# Patient Record
Sex: Female | Born: 1965 | Marital: Single
Health system: Southern US, Community
[De-identification: ages and names within clinical notes are randomized; demographics above are authoritative.]

## PROBLEM LIST (undated history)

## (undated) DIAGNOSIS — F319 Bipolar disorder, unspecified: Secondary | ICD-10-CM

## (undated) DIAGNOSIS — E119 Type 2 diabetes mellitus without complications: Secondary | ICD-10-CM

## (undated) DIAGNOSIS — F1910 Other psychoactive substance abuse, uncomplicated: Secondary | ICD-10-CM

## (undated) DIAGNOSIS — B1920 Unspecified viral hepatitis C without hepatic coma: Secondary | ICD-10-CM

## (undated) DIAGNOSIS — G8929 Other chronic pain: Secondary | ICD-10-CM

## (undated) DIAGNOSIS — F209 Schizophrenia, unspecified: Secondary | ICD-10-CM

## (undated) DIAGNOSIS — I38 Endocarditis, valve unspecified: Secondary | ICD-10-CM

---

## 2016-10-12 ENCOUNTER — Ambulatory Visit (INDEPENDENT_AMBULATORY_CARE_PROVIDER_SITE_OTHER): Payer: Medicaid Other | Admitting: Orthopaedic Surgery

## 2016-10-12 ENCOUNTER — Encounter (INDEPENDENT_AMBULATORY_CARE_PROVIDER_SITE_OTHER): Payer: Self-pay

## 2016-10-26 ENCOUNTER — Inpatient Hospital Stay (HOSPITAL_COMMUNITY): Payer: Medicaid Other

## 2016-10-26 ENCOUNTER — Encounter (HOSPITAL_COMMUNITY): Payer: Self-pay | Admitting: Pulmonary Disease

## 2016-10-26 ENCOUNTER — Inpatient Hospital Stay (HOSPITAL_COMMUNITY)
Admission: EM | Admit: 2016-10-26 | Discharge: 2016-11-20 | DRG: 296 | Disposition: E | Payer: Medicaid Other | Attending: Pulmonary Disease | Admitting: Pulmonary Disease

## 2016-10-26 ENCOUNTER — Emergency Department (HOSPITAL_COMMUNITY): Payer: Medicaid Other

## 2016-10-26 DIAGNOSIS — G253 Myoclonus: Secondary | ICD-10-CM | POA: Diagnosis not present

## 2016-10-26 DIAGNOSIS — Q893 Situs inversus: Secondary | ICD-10-CM | POA: Diagnosis not present

## 2016-10-26 DIAGNOSIS — I959 Hypotension, unspecified: Secondary | ICD-10-CM | POA: Diagnosis present

## 2016-10-26 DIAGNOSIS — N179 Acute kidney failure, unspecified: Secondary | ICD-10-CM | POA: Diagnosis present

## 2016-10-26 DIAGNOSIS — J9601 Acute respiratory failure with hypoxia: Secondary | ICD-10-CM

## 2016-10-26 DIAGNOSIS — G931 Anoxic brain damage, not elsewhere classified: Secondary | ICD-10-CM | POA: Diagnosis present

## 2016-10-26 DIAGNOSIS — Z529 Donor of unspecified organ or tissue: Secondary | ICD-10-CM

## 2016-10-26 DIAGNOSIS — G936 Cerebral edema: Secondary | ICD-10-CM | POA: Diagnosis present

## 2016-10-26 DIAGNOSIS — E872 Acidosis: Secondary | ICD-10-CM | POA: Diagnosis present

## 2016-10-26 DIAGNOSIS — E876 Hypokalemia: Secondary | ICD-10-CM | POA: Diagnosis present

## 2016-10-26 DIAGNOSIS — H5704 Mydriasis: Secondary | ICD-10-CM

## 2016-10-26 DIAGNOSIS — D72829 Elevated white blood cell count, unspecified: Secondary | ICD-10-CM | POA: Diagnosis present

## 2016-10-26 DIAGNOSIS — Z515 Encounter for palliative care: Secondary | ICD-10-CM | POA: Diagnosis present

## 2016-10-26 DIAGNOSIS — G935 Compression of brain: Secondary | ICD-10-CM | POA: Diagnosis not present

## 2016-10-26 DIAGNOSIS — F209 Schizophrenia, unspecified: Secondary | ICD-10-CM | POA: Diagnosis present

## 2016-10-26 DIAGNOSIS — B1920 Unspecified viral hepatitis C without hepatic coma: Secondary | ICD-10-CM | POA: Diagnosis present

## 2016-10-26 DIAGNOSIS — F1110 Opioid abuse, uncomplicated: Secondary | ICD-10-CM | POA: Diagnosis present

## 2016-10-26 DIAGNOSIS — E119 Type 2 diabetes mellitus without complications: Secondary | ICD-10-CM | POA: Diagnosis present

## 2016-10-26 DIAGNOSIS — G8929 Other chronic pain: Secondary | ICD-10-CM | POA: Diagnosis present

## 2016-10-26 DIAGNOSIS — E46 Unspecified protein-calorie malnutrition: Secondary | ICD-10-CM | POA: Diagnosis present

## 2016-10-26 DIAGNOSIS — J9602 Acute respiratory failure with hypercapnia: Secondary | ICD-10-CM | POA: Diagnosis present

## 2016-10-26 DIAGNOSIS — J9690 Respiratory failure, unspecified, unspecified whether with hypoxia or hypercapnia: Secondary | ICD-10-CM

## 2016-10-26 DIAGNOSIS — I4901 Ventricular fibrillation: Secondary | ICD-10-CM | POA: Diagnosis present

## 2016-10-26 DIAGNOSIS — F1410 Cocaine abuse, uncomplicated: Secondary | ICD-10-CM | POA: Diagnosis present

## 2016-10-26 DIAGNOSIS — I469 Cardiac arrest, cause unspecified: Principal | ICD-10-CM

## 2016-10-26 DIAGNOSIS — G9382 Brain death: Secondary | ICD-10-CM | POA: Diagnosis not present

## 2016-10-26 DIAGNOSIS — Z6828 Body mass index (BMI) 28.0-28.9, adult: Secondary | ICD-10-CM

## 2016-10-26 DIAGNOSIS — Z66 Do not resuscitate: Secondary | ICD-10-CM | POA: Diagnosis present

## 2016-10-26 DIAGNOSIS — Z452 Encounter for adjustment and management of vascular access device: Secondary | ICD-10-CM

## 2016-10-26 DIAGNOSIS — J690 Pneumonitis due to inhalation of food and vomit: Secondary | ICD-10-CM

## 2016-10-26 DIAGNOSIS — F319 Bipolar disorder, unspecified: Secondary | ICD-10-CM | POA: Diagnosis present

## 2016-10-26 DIAGNOSIS — Z9289 Personal history of other medical treatment: Secondary | ICD-10-CM

## 2016-10-26 DIAGNOSIS — Z79899 Other long term (current) drug therapy: Secondary | ICD-10-CM

## 2016-10-26 HISTORY — DX: Other psychoactive substance abuse, uncomplicated: F19.10

## 2016-10-26 HISTORY — DX: Bipolar disorder, unspecified: F31.9

## 2016-10-26 HISTORY — DX: Unspecified viral hepatitis C without hepatic coma: B19.20

## 2016-10-26 HISTORY — DX: Other chronic pain: G89.29

## 2016-10-26 HISTORY — DX: Type 2 diabetes mellitus without complications: E11.9

## 2016-10-26 HISTORY — DX: Schizophrenia, unspecified: F20.9

## 2016-10-26 HISTORY — DX: Endocarditis, valve unspecified: I38

## 2016-10-26 LAB — RAPID URINE DRUG SCREEN, HOSP PERFORMED
Amphetamines: NOT DETECTED
BENZODIAZEPINES: NOT DETECTED
Barbiturates: NOT DETECTED
COCAINE: NOT DETECTED
Opiates: POSITIVE — AB
Tetrahydrocannabinol: NOT DETECTED

## 2016-10-26 LAB — I-STAT ARTERIAL BLOOD GAS, ED
ACID-BASE DEFICIT: 4 mmol/L — AB (ref 0.0–2.0)
Acid-base deficit: 14 mmol/L — ABNORMAL HIGH (ref 0.0–2.0)
Bicarbonate: 16.4 mmol/L — ABNORMAL LOW (ref 20.0–28.0)
Bicarbonate: 19.7 mmol/L — ABNORMAL LOW (ref 20.0–28.0)
O2 SAT: 99 %
O2 SAT: 100 %
PCO2 ART: 58.3 mmHg — AB (ref 32.0–48.0)
PH ART: 7.057 — AB (ref 7.350–7.450)
PH ART: 7.395 (ref 7.350–7.450)
PO2 ART: 224 mmHg — AB (ref 83.0–108.0)
Patient temperature: 98.6
TCO2: 18 mmol/L (ref 0–100)
TCO2: 21 mmol/L (ref 0–100)
pCO2 arterial: 32.2 mmHg (ref 32.0–48.0)
pO2, Arterial: 327 mmHg — ABNORMAL HIGH (ref 83.0–108.0)

## 2016-10-26 LAB — CBC WITH DIFFERENTIAL/PLATELET
Basophils Absolute: 0 10*3/uL (ref 0.0–0.1)
Basophils Relative: 0 %
EOS ABS: 0 10*3/uL (ref 0.0–0.7)
EOS PCT: 0 %
HCT: 36.5 % (ref 36.0–46.0)
Hemoglobin: 11.7 g/dL — ABNORMAL LOW (ref 12.0–15.0)
LYMPHS ABS: 8 10*3/uL — AB (ref 0.7–4.0)
Lymphocytes Relative: 33 %
MCH: 29 pg (ref 26.0–34.0)
MCHC: 32.1 g/dL (ref 30.0–36.0)
MCV: 90.6 fL (ref 78.0–100.0)
MONO ABS: 1 10*3/uL (ref 0.1–1.0)
Monocytes Relative: 4 %
Neutro Abs: 15.2 10*3/uL — ABNORMAL HIGH (ref 1.7–7.7)
Neutrophils Relative %: 63 %
PLATELETS: 237 10*3/uL (ref 150–400)
RBC: 4.03 MIL/uL (ref 3.87–5.11)
RDW: 13.3 % (ref 11.5–15.5)
WBC: 24.2 10*3/uL — AB (ref 4.0–10.5)

## 2016-10-26 LAB — URINALYSIS, ROUTINE W REFLEX MICROSCOPIC
BILIRUBIN URINE: NEGATIVE
GLUCOSE, UA: 250 mg/dL — AB
KETONES UR: NEGATIVE mg/dL
Leukocytes, UA: NEGATIVE
Nitrite: NEGATIVE
PH: 7 (ref 5.0–8.0)
SPECIFIC GRAVITY, URINE: 1.011 (ref 1.005–1.030)

## 2016-10-26 LAB — URINE MICROSCOPIC-ADD ON

## 2016-10-26 LAB — I-STAT TROPONIN, ED: TROPONIN I, POC: 0 ng/mL (ref 0.00–0.08)

## 2016-10-26 LAB — ACETAMINOPHEN LEVEL

## 2016-10-26 LAB — APTT: aPTT: 37 seconds — ABNORMAL HIGH (ref 24–36)

## 2016-10-26 LAB — ETHANOL

## 2016-10-26 LAB — SALICYLATE LEVEL

## 2016-10-26 LAB — I-STAT BETA HCG BLOOD, ED (MC, WL, AP ONLY)

## 2016-10-26 LAB — PROTIME-INR
INR: 1.28
Prothrombin Time: 16 seconds — ABNORMAL HIGH (ref 11.4–15.2)

## 2016-10-26 MED ORDER — EPINEPHRINE PF 1 MG/ML IJ SOLN
0.5000 ug/min | INTRAVENOUS | Status: DC
  Administered 2016-10-26: 10 ug/min via INTRAVENOUS
  Filled 2016-10-26: qty 4

## 2016-10-26 MED ORDER — ATROPINE SULFATE 1 MG/ML IJ SOLN
INTRAMUSCULAR | Status: AC | PRN
  Administered 2016-10-26: 1 mg via INTRAVENOUS

## 2016-10-26 MED ORDER — SODIUM CHLORIDE 0.9 % IV SOLN: 250.0000 mL | INTRAVENOUS | Status: DC | PRN

## 2016-10-26 MED ORDER — STERILE WATER FOR INJECTION IV SOLN
INTRAVENOUS | Status: DC
  Administered 2016-10-26: 22:00:00 via INTRAVENOUS
  Filled 2016-10-26 (×8): qty 850

## 2016-10-26 MED ORDER — SODIUM CHLORIDE 0.9 % IV SOLN
3.0000 g | Freq: Once | INTRAVENOUS | Status: AC
  Administered 2016-10-26: 3 g via INTRAVENOUS
  Filled 2016-10-26: qty 3

## 2016-10-26 MED ORDER — SODIUM BICARBONATE 8.4 % IV SOLN
100.0000 meq | Freq: Once | INTRAVENOUS | Status: AC
  Administered 2016-10-26: 100 meq via INTRAVENOUS

## 2016-10-26 MED ORDER — FENTANYL CITRATE (PF) 100 MCG/2ML IJ SOLN: 50.0000 ug | INTRAMUSCULAR | Status: DC | PRN

## 2016-10-26 MED ORDER — PROPOFOL 1000 MG/100ML IV EMUL: 5.0000 ug/kg/min | Freq: Once | INTRAVENOUS | Status: DC

## 2016-10-26 MED ORDER — SODIUM CHLORIDE 0.9 % IV SOLN
INTRAVENOUS | Status: DC
  Administered 2016-10-26 – 2016-10-29 (×5): via INTRAVENOUS

## 2016-10-26 MED ORDER — PROPOFOL 1000 MG/100ML IV EMUL
INTRAVENOUS | Status: AC
  Administered 2016-10-27: 35 ug/kg/min via INTRAVENOUS
  Filled 2016-10-26: qty 100

## 2016-10-26 MED ORDER — HEPARIN SODIUM (PORCINE) 5000 UNIT/ML IJ SOLN
5000.0000 [IU] | Freq: Three times a day (TID) | INTRAMUSCULAR | Status: DC
  Administered 2016-10-26 – 2016-10-29 (×8): 5000 [IU] via SUBCUTANEOUS
  Filled 2016-10-26 (×8): qty 1

## 2016-10-26 MED ORDER — FAMOTIDINE IN NACL 20-0.9 MG/50ML-% IV SOLN
20.0000 mg | Freq: Two times a day (BID) | INTRAVENOUS | Status: DC
  Administered 2016-10-26 – 2016-10-29 (×6): 20 mg via INTRAVENOUS
  Filled 2016-10-26 (×5): qty 50

## 2016-10-26 MED ORDER — PROPOFOL 1000 MG/100ML IV EMUL
5.0000 ug/kg/min | INTRAVENOUS | Status: DC
  Administered 2016-10-26: 10 ug/kg/min via INTRAVENOUS

## 2016-10-26 MED ORDER — PROPOFOL 1000 MG/100ML IV EMUL
0.0000 ug/kg/min | INTRAVENOUS | Status: DC
  Administered 2016-10-27: 35 ug/kg/min via INTRAVENOUS
  Filled 2016-10-26: qty 100

## 2016-10-26 MED ORDER — CEFTRIAXONE SODIUM 1 G IJ SOLR
1.0000 g | Freq: Once | INTRAMUSCULAR | Status: DC
  Administered 2016-10-26: 1 g via INTRAVENOUS
  Filled 2016-10-26: qty 10

## 2016-10-26 MED ORDER — AZITHROMYCIN 500 MG IV SOLR
500.0000 mg | Freq: Once | INTRAVENOUS | Status: DC
  Filled 2016-10-26: qty 500

## 2016-10-26 MED ORDER — INSULIN ASPART 100 UNIT/ML ~~LOC~~ SOLN
2.0000 [IU] | SUBCUTANEOUS | Status: DC
  Administered 2016-10-27 – 2016-10-29 (×8): 2 [IU] via SUBCUTANEOUS

## 2016-10-26 NOTE — ED Provider Notes (Signed)
[facility]-EMERGENCY DEPT Provider Note   CSN: [id number] Arrival date & time: 10/21/2016  1955  History   Chief Complaint No chief complaint on file.  HPI Melanie [name] is a 50 y.o. female.   Cardiac Arrest  Witnessed by:  Not witnessed Time since incident:  50 minutes Time before BLS initiated:  > 5 minutes Time before ALS initiated:  > 10 minutes Condition upon EMS arrival:  Unresponsive Pulse:  Absent Initial cardiac rhythm per EMS:  PEA Treatments prior to arrival:  ACLS protocol Airway:  Intubation prior to arrival Rhythm on admission to ED:  Normal sinus   Past Medical History:  Diagnosis Date  . Bipolar 1 disorder (HCC)   . Chronic pain   . DM (diabetes mellitus) (HCC)   . Hepatitis C   . Intravenous drug abuse   . Polysubstance abuse   . Schizophrenia [facility](HCC)     Patient Active Problem List   Diagnosis Date Noted  . Cardiac arrest (HCC) Oct 11, 2016   No past surgical history on file.  OB History    No data available     Home Medications    Prior to Admission medications   Not on File    Family History No family history on file.  Social History Social History  Substance Use Topics  . Smoking status: Not on file  . Smokeless tobacco: Not on file  . Alcohol use Not on file   Allergies   Patient has no allergy information on record.   Review of Systems Review of Systems  Unable to perform ROS: Intubated   Physical Exam Updated Vital Signs BP (!) 75/61   Pulse 64   Resp 20   SpO2 100%   Physical Exam  Constitutional:  Intubated female  HENT:  Head: Normocephalic and atraumatic.  Eyes:  Fixed pupils 8 mm bilaterally  Neck: Normal range of motion. Neck supple.  Cardiovascular: Normal rate and regular rhythm.   Sinus rhythm  Pulmonary/Chest:  Course breath sounds bilaterally  Abdominal: Soft. She exhibits no distension. There is no tenderness.  Musculoskeletal: She exhibits no edema.  Neurological:  Unresponsive  Skin: Skin is dry.  Capillary refill takes more than 3 seconds. There is pallor.  Psychiatric:  Unresponsive  Nursing note and vitals reviewed.  ED Treatments / Results  Labs (all labs ordered are listed, but only abnormal results are displayed) Labs Reviewed  I-STAT ARTERIAL BLOOD GAS, ED - Abnormal; Notable for the following:       Result Value   pH, Arterial 7.057 (*)    pCO2 arterial 58.3 (*)    pO2, Arterial 224.0 (*)    Bicarbonate 16.4 (*)    Acid-base deficit 14.0 (*)    All other components within normal limits  CBC WITH DIFFERENTIAL/PLATELET  APTT  PROTIME-INR  RAPID URINE DRUG SCREEN, HOSP PERFORMED  URINALYSIS, ROUTINE W REFLEX MICROSCOPIC (NOT AT [facility])  ACETAMINOPHEN LEVEL  ETHANOL  SALICYLATE LEVEL  I-STAT TROPOININ, ED  I-STAT BETA HCG BLOOD, ED ([facility], [facility], [facility] ONLY)   EKG  EKG Interpretation  Date/Time:  Monday October 26 2016 20:20:17 EST Ventricular Rate:  97 PR Interval:    QRS Duration: 74 QT Interval:  398 QTC Calculation: 506 R Axis:   176 Text Interpretation:  Sinus rhythm Ventricular premature complex Anterolateral infarct, age indeterminate Baseline wander in lead(s) V3 No significant change since last tracing Confirmed by YAO  MD, DAVID ([id number]) on 11/05/2016 8:35:30 PM      Radiology Dg Chest Portable  1 View  Result Date: 10/24/2016 CLINICAL DATA:  50 y/o  F; status post CPR and intubation. EXAM: PORTABLE CHEST 1 VIEW COMPARISON:  None. FINDINGS: Situs inversus and dextro cardia. Transcutaneous pacing pads are on left hemi thorax. Enteric tube coilsin the right upper quadrant stomach with tip below the field of view in the abdomen. Endotracheal tube tip is at the carina on the first chest radiograph and on the third has been retracted 2.5 cm from the carina. Ill-defined left cardiophrenic angle opacity. No acute osseous abnormality is evident. IMPRESSION: Situs inversus and dextrocardia. Endotracheal tube tip is at the carina on the first chest radiograph and on the  third has been retracted 2.5 cm from the carina. Ill-defined left basilar opacity. Electronically Signed   By: Mitzi HansenLance  Furusawa-Stratton M.D.   On: 11/05/2016 21:00    Procedures Procedures (including critical care time)  Medications Ordered in ED Medications  EPINEPHrine (ADRENALIN) 4 mg in dextrose 5 % 250 mL (0.016 mg/mL) infusion (0 mcg/min Intravenous Stopped 11/08/2016 2128)  sodium bicarbonate 150 mEq in sterile water 1,000 mL infusion (not administered)  cefTRIAXone (ROCEPHIN) 1 g in dextrose 5 % 50 mL IVPB (not administered)  azithromycin (ZITHROMAX) 500 mg in dextrose 5 % 250 mL IVPB (not administered)  propofol (DIPRIVAN) 1000 MG/100ML infusion (29 mcg/kg/min  72.6 kg Intravenous Rate/Dose Change 11/16/2016 2141)  atropine injection (1 mg Intravenous Given 10/25/2016 1958)  sodium bicarbonate injection 100 mEq (100 mEq Intravenous Given 11/17/2016 2112)    Initial Impression / Assessment and Plan / ED Course  I have reviewed the triage vital signs and the nursing notes.  Pertinent labs & imaging results that were available during my care of the patient were reviewed by me and considered in my medical decision making (see chart for details).  Clinical Course    Melanie [name] is a 50 year old female with reported chronic abuse history who presents to emergency Department today status post cardiac arrest. Per EMS patient had been found by her significant other reportedly 30 minutes after he spoke to her, pulseless and apneic. EMS arrived and patient was in PEA. Patient received 5 rounds of epinephrine, CPR and regained pulses. She was intubated by EMS. En route patient lost pulses twice, once with ventricular fibrillation and was subsequently shocked with ROSC and the second time PEA which was regained after another milligram of epi. Patient was placed on epi drip.  Upon arrival patient has a right IO in place. Left 18-gauge EJ was placed. Patient became bradycardic and was given push dose epi  with subsequent improvement hemodynamics and pulse.  Fluids were started an epinephrine drip was continued. Chest x-ray confirmed ET tube placement. OG was placed.  Patient has fixed dilated pupils. PH is 7.056 more than likely secondary to lactic acidosis. Concern for significant neurological deficit and declined given her prolonged downtime.  Propofol gtt started for sedation with new gag reflex.  Patient found to have opacities on x-ray, more than likely aspiration in nature. ABX given.   Patient admitted to ICU who requested bicarb bolus and infusion.   Final Clinical Impressions(s) / ED Diagnoses   Final diagnoses:  Cardiac arrest [facility](HCC)     Deirdre PeerJeremiah Jamya Starry, MD 10/27/16 0009    Charlynne Panderavid Hsienta Yao, MD 10/27/16 [phone]

## 2016-10-26 NOTE — H&P (Signed)
PULMONARY / CRITICAL CARE MEDICINE   Name: Melanie [name] MRN: [id number] DOB: 1966-07-16    ADMISSION DATE:  10/21/2016 CONSULTATION DATE:  11/6  REFERRING MD:  Dr. Silverio Lay EDP  CHIEF COMPLAINT:  Cardiac arrest  HISTORY OF PRESENT ILLNESS:  Patient is encephalopathic and/or intubated. Therefore history has [name] obtained from chart review. 50 year old female with known history of DM, bipolar, shizophrenia, and polysubstance abuse (heroin, cocaine) found down at home with an unwitnessed cardiac arrest. Last known well 30 minutes prior while speaking with boyfriend telephone. Upon EMS arrival she was found to be in asystole and ACLS protocol was initiated, and continued for 22 minutes. She briefly regained pulses and lost him again with ventricular fibrillation, which responded to epinephrine and defibrillation. She remained bradycardic and was paced in route to the emergency room. Upon arrival to the emergency department she was placed on ventilator and started on epinephrine infusion. Neurologic exam remained poor with pupils fixed and dilated, comatose, and no cough/gag. Pulmonary critical care asked to admit.  PAST MEDICAL HISTORY :  She  has a past medical history of Bipolar 1 disorder (HCC); Chronic pain; DM (diabetes mellitus) (HCC); Hepatitis C; Intravenous drug abuse; Polysubstance abuse; and Schizophrenia (HCC).  PAST SURGICAL HISTORY: She  has no past surgical history on file.  Allergies not on file  No current facility-administered medications on file prior to encounter.    No current outpatient prescriptions on file prior to encounter.    FAMILY HISTORY:  Her has no family status information on file.    SOCIAL HISTORY: She    REVIEW OF SYSTEMS:   Unable  SUBJECTIVE:    VITAL SIGNS: BP (!) 140/119   Pulse 95   Temp (!) 92.7 F (33.7 C) (Rectal)   Resp 15   Ht 5\' 2"  (1.575 m)   Wt 72.6 kg (160 lb)   SpO2 99%   BMI 29.26 kg/m   HEMODYNAMICS:    VENTILATOR  SETTINGS: Vent Mode: PRVC FiO2 (%):  [60 %] 60 % Set Rate:  [20 bmp] 20 bmp Vt Set:  [430 mL] 430 mL PEEP:  [5 cmH20] 5 cmH20 Plateau Pressure:  [23 cmH20] 23 cmH20  INTAKE / OUTPUT: No intake/output data recorded.  PHYSICAL EXAMINATION: General:  Thin malnourished female on ventilator Neuro:  Comatose, no cough, no gag, pupils fixed, dilated HEENT:  [place]/AT, no appreciable JVD Cardiovascular:  RRR, no MRG Lungs:  Coarse Abdomen:  Soft, non-distended Musculoskeletal:  No acute deformity Skin:  Grossly intact  LABS:  BMET No results for input(s): NA, K, CL, CO2, BUN, CREATININE, GLUCOSE in the last 168 hours.  Electrolytes No results for input(s): CALCIUM, MG, PHOS in the last 168 hours.  CBC  Recent Labs Lab  2006  WBC 24.2*  HGB 11.7*  HCT 36.5  PLT 237    Coag's  Recent Labs Lab 11/18/2016 2006  APTT 37*  INR 1.28    Sepsis Markers No results for input(s): LATICACIDVEN, PROCALCITON, O2SATVEN in the last 168 hours.  ABG  Recent Labs Lab 10/24/2016 2025  PHART 7.057*  PCO2ART 58.3*  PO2ART 224.0*    Liver Enzymes No results for input(s): AST, ALT, ALKPHOS, BILITOT, ALBUMIN in the last 168 hours.  Cardiac Enzymes No results for input(s): TROPONINI, PROBNP in the last 168 hours.  Glucose No results for input(s): GLUCAP in the last 168 hours.  Imaging Dg Chest Portable 1 View  Result Date: 11/02/2016 CLINICAL DATA:  50 y/o  F; status post CPR and  intubation. EXAM: PORTABLE CHEST 1 VIEW COMPARISON:  None. FINDINGS: Situs inversus and dextro cardia. Transcutaneous pacing pads are on left hemi thorax. Enteric tube coilsin the right upper quadrant stomach with tip below the field of view in the abdomen. Endotracheal tube tip is at the carina on the first chest radiograph and on the third has [name] retracted 2.5 cm from the carina. Ill-defined left cardiophrenic angle opacity. No acute osseous abnormality is evident. IMPRESSION: Situs inversus and  dextrocardia. Endotracheal tube tip is at the carina on the first chest radiograph and on the third has [name] retracted 2.5 cm from the carina. Ill-defined left basilar opacity. Electronically Signed   By: Mitzi HansenLance  Furusawa-Stratton M.D.   On: 2016/02/08 21:00     STUDIES:  CT head >>>  CULTURES: Blood 11/6 >  ANTIBIOTICS: unasyn 11/6  SIGNIFICANT EVENTS: 11/6 cardiac arrest  LINES/TUBES: ETT 11/6 >  DISCUSSION: 50 year old female with known psychiatric history and history of polysubstance abuse. Suffered an unwitnessed cardiac arrest with potentially 30 minutes of downtime prior to being found. Once found EMS was called and responded with at least 22 minutes of CPR. Initial rhythm asystole. Now his hemodynamic stable off pressors, but is on ventilator with very poor neurologic exam. Admit to ICU for supportive care and further diagnostic workup. Not a candidate for targeted temperature management and therapy.  ASSESSMENT / PLAN:  PULMONARY A: Inability to protect airway status post cardiac arrest  P:   Full vent support Follow ABG CXR in AM VAP bundle  CARDIOVASCULAR A:  Asystolic cardiac arrest 45mins, suspect in setting overdose. Situs inversus, dextrocardia  P:  Telemetry monitoring Not a candidate for targeted temperature management due to character, duration, and likely cause of cardiac arrest. Epinephrine infusion as needed to keep MAP greater than 65 mmHg Follow cardiac enzymes Echocardiogram  RENAL A:   Labs pending  P:   Follow renal function and urine output Correct electrolytes as indicated  GASTROINTESTINAL A:   No acute issues  P:   NPO Pepcid for SUP  HEMATOLOGIC A:   No acute issues P: Follow CBC Subcutaneous heparin SCDs  INFECTIOUS A:   Leukocytosis - possibly in setting of aspiration, although no clear evidence of this  P:   Follow cultures  Unasyn for possible aspiration pneumonia  ENDOCRINE A:   Diabetes mellitus  P:    CBG monitoring   sliding-scale insulin  NEUROLOGIC A:    Acute anoxic encephalopathy  P:   RASS goal: 0 to -1 Propofol to help with vent synchrony CT head pending EEG Avoid fevers  FAMILY  - Updates: Sister extensively updated in emergency room. Patient's daughter lives here in [place] and her mother lives in [place]. Sister will fill them both in and they will call with questions.  - Inter-disciplinary family meet or Palliative Care meeting due by:  11/12   Joneen RoachPaul Hoffman, AGACNP-BC [facility] Pager [phone] or [phone]  10/24/2016 10:20 PM    ATTENDING NOTE / ATTESTATION NOTE :   I have discussed the case with the resident/APP  Joneen RoachPaul Hoffman.   I agree with the resident/APP's  history, physical examination, assessment, and plans.    I have edited the above note and modified it according to our agreed history, physical examination, assessment and plan.   Briefly, pt admitted after being found unresponsive by her boyfriend at home.  She has chronic pain issues (on suboxone), recent diagnosis of hepatitis, on top of DM, bipolar, schizophrenia.  Boyfriend spoke  with her 30 minutes before she was found unresponsive. Total downtime roughly at least 40 minutes per EMS/ED report.  She was pulseless for the most part, had 1 episode of vfib for which she had defibrillation. She was started on epinephrine drip at the ER which was subsequently weaned off. She was started on propofol drip for myoclonic jerks and ventilator dyssynchrony.  Patient was seen, examined. Sedated. Breathing over the vent. Blood pressure 133/96, heart rate 80, respiratory rate 30, 100% O2 sats on 60% FiO2. Hypothermic. As mentioned, patient was sedated with propofol  when I saw her. Pupils were 4- 5 mm bilaterally non reactive, (-) visual threat, (-) corneals, (-) facial asymmetry, (-) gag, (-) grimace to deep sternal rub. Negative neck vein distention. Good breath sounds, equal  and bilateral. Good S1, S2. No murmurs heard. Diminished bowel sounds. No masses or tenderness. No edema. Cool extremities. Pulses were palpable. Rest of exam per Paul's exam.   Labs reviewed. 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 Possible left lower lobe infiltrate versus atelectasis. ABG 7.4/32/327 and a think that's on 100% FiO2. WBC 24. Blood alcohol was less than 5. Drug screen was positive for opiates. Rest of chemistries was pending by the time I saw her.   Assessment : 1. Acute hypoxemic hypercapnic respiratory failure secondary to unable to protect the airway. Rule out aspiration pneumonia. 2. S/P Prolonged cardiac arrest, 40 minutes downtime per EMS/ED records. Possible drug overdose by history.  3. 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  4. Likely with demand ischemia, lactic acidosis, AKI, metabolic acidosis 2/2 above >> labs still pending.   Plan : 1. We were able to speak with patient's daughter who lives in town. The patient's mother and son live in [place]. The mother has [name] updated. Patient's boyfriend has also [name] updated. We mentioned to the patient's daughter that the patient is critically ill and she has most likely  sustained significant neurologic injury secondary to her prolonged cardiac arrest. We mentioned to her that she is critically ill. They were obviously not ready to discuss CODE STATUS now. She is currently a full code. I mentioned the next 12-24 hours, we need to  reassess her neurologic status and reassess her CODE STATUS. 2. Continue ventilatory support. Not ready for weaning. Titrate FiO2 to keep O2 saturation more than or equal to 90%. 3. Follow-up on chemistries, lactic acid. 4. Continue Unasyn for aspiration pneumonia. Suggest to de-escalate once with cultures and pro calcitonin level. 5. Continue IV fluids. 6. Keep nothing by mouth for now. 7. In the morning, reassess if it can take her  off the propofol. 8. Patient will need a cranial CT scan. 9. We decided to hold off on therapeutic hypothermia because of prolonged downtime in poor prognosis. 10.  Cont other meds.  11. DVT prophylaxis.   I have spent 35  minutes of critical care time with this patient today.  Family :Family updated at length today.  We spoke with patient's daughter.   Pollie MeyerJ. Angelo A de Dios, MD 11/11/2016, 11:52 PM [facility] Pager [phone] After 3 pm or if no answer, call [phone]

## 2016-10-26 NOTE — ED Notes (Signed)
Bair hugger applied.

## 2016-10-26 NOTE — ED Notes (Signed)
Attempted report 

## 2016-10-26 NOTE — ED Notes (Signed)
Failed IV attempt x1. IV infiltrated

## 2016-10-26 NOTE — ED Notes (Signed)
Epi started a minute

## 2016-10-26 NOTE — Progress Notes (Signed)
Chaplain was notifiued of a post cpr. Pt came in not alert. Chaplain notified front dest to notify him when family came. No one show up.   10/31/2016 2100  Clinical Encounter Type  Visited With Patient  Visit Type Initial  Referral From Care management  Spiritual Encounters  Spiritual Needs Emotional  Stress Factors  Patient Stress Factors None identified

## 2016-10-26 NOTE — ED Notes (Signed)
Pt arrives to ED after a cardiac arrest, found down and was in asystole upon EMS arrival. Initial rhythm asystole. They performed approx 22 minutes CPR and got pulses back once, pt noted to be in sinus tach then bradyed down and arrested again. One round of epi put patient in bradycardia again, EMS then paced for about 6 minutes intermittently. ROSC at 1947. Pt received a total of 6 epis and 2MG  narcan. Pupils nonreactive, 5MM  Hx diabetes and substance abuse. No family at bedside at this time.

## 2016-10-26 NOTE — Code Documentation (Signed)
No purposeful movement at this time.

## 2016-10-27 ENCOUNTER — Encounter (HOSPITAL_COMMUNITY): Payer: Self-pay | Admitting: *Deleted

## 2016-10-27 ENCOUNTER — Inpatient Hospital Stay (HOSPITAL_COMMUNITY): Payer: Medicaid Other

## 2016-10-27 DIAGNOSIS — I10 Essential (primary) hypertension: Secondary | ICD-10-CM

## 2016-10-27 DIAGNOSIS — G931 Anoxic brain damage, not elsewhere classified: Secondary | ICD-10-CM

## 2016-10-27 DIAGNOSIS — J9601 Acute respiratory failure with hypoxia: Secondary | ICD-10-CM | POA: Insufficient documentation

## 2016-10-27 DIAGNOSIS — J690 Pneumonitis due to inhalation of food and vomit: Secondary | ICD-10-CM | POA: Insufficient documentation

## 2016-10-27 LAB — BLOOD CULTURE ID PANEL (REFLEXED)
ACINETOBACTER BAUMANNII: NOT DETECTED
CANDIDA PARAPSILOSIS: NOT DETECTED
Candida albicans: NOT DETECTED
Candida glabrata: NOT DETECTED
Candida krusei: NOT DETECTED
Candida tropicalis: NOT DETECTED
ENTEROCOCCUS SPECIES: NOT DETECTED
Enterobacter cloacae complex: NOT DETECTED
Enterobacteriaceae species: NOT DETECTED
Escherichia coli: NOT DETECTED
HAEMOPHILUS INFLUENZAE: NOT DETECTED
KLEBSIELLA OXYTOCA: NOT DETECTED
Klebsiella pneumoniae: NOT DETECTED
Listeria monocytogenes: NOT DETECTED
METHICILLIN RESISTANCE: DETECTED — AB
Neisseria meningitidis: NOT DETECTED
PSEUDOMONAS AERUGINOSA: NOT DETECTED
Proteus species: NOT DETECTED
SERRATIA MARCESCENS: NOT DETECTED
STAPHYLOCOCCUS AUREUS BCID: NOT DETECTED
STREPTOCOCCUS PNEUMONIAE: NOT DETECTED
Staphylococcus species: DETECTED — AB
Streptococcus agalactiae: NOT DETECTED
Streptococcus pyogenes: NOT DETECTED
Streptococcus species: NOT DETECTED

## 2016-10-27 LAB — GLUCOSE, CAPILLARY
GLUCOSE-CAPILLARY: 139 mg/dL — AB (ref 65–99)
Glucose-Capillary: 147 mg/dL — ABNORMAL HIGH (ref 65–99)
Glucose-Capillary: 103 mg/dL — ABNORMAL HIGH (ref 65–99)
Glucose-Capillary: 127 mg/dL — ABNORMAL HIGH (ref 65–99)
Glucose-Capillary: 121 mg/dL — ABNORMAL HIGH (ref 65–99)

## 2016-10-27 LAB — CBC
HCT: 42 % (ref 36.0–46.0)
HEMATOCRIT: 40.6 % (ref 36.0–46.0)
HEMOGLOBIN: 14.3 g/dL (ref 12.0–15.0)
Hemoglobin: 13.8 g/dL (ref 12.0–15.0)
MCH: 29.4 pg (ref 26.0–34.0)
MCH: 29.5 pg (ref 26.0–34.0)
MCHC: 34 g/dL (ref 30.0–36.0)
MCHC: 34 g/dL (ref 30.0–36.0)
MCV: 86.8 fL (ref 78.0–100.0)
MCV: 86.6 fL (ref 78.0–100.0)
PLATELETS: 123 10*3/uL — AB (ref 150–400)
Platelets: 129 10*3/uL — ABNORMAL LOW (ref 150–400)
RBC: 4.69 MIL/uL (ref 3.87–5.11)
RBC: 4.84 MIL/uL (ref 3.87–5.11)
RDW: 13.4 % (ref 11.5–15.5)
RDW: 13.3 % (ref 11.5–15.5)
WBC: 20.7 10*3/uL — ABNORMAL HIGH (ref 4.0–10.5)
WBC: 16.7 10*3/uL — AB (ref 4.0–10.5)

## 2016-10-27 LAB — COMPREHENSIVE METABOLIC PANEL
ALBUMIN: 3.4 g/dL — AB (ref 3.5–5.0)
ALK PHOS: 87 U/L (ref 38–126)
ALT: 180 U/L — ABNORMAL HIGH (ref 14–54)
ANION GAP: 18 — AB (ref 5–15)
AST: 312 U/L — ABNORMAL HIGH (ref 15–41)
BUN: 19 mg/dL (ref 6–20)
CHLORIDE: 105 mmol/L (ref 101–111)
CO2: 17 mmol/L — AB (ref 22–32)
Calcium: 8.3 mg/dL — ABNORMAL LOW (ref 8.9–10.3)
Creatinine, Ser: 1.46 mg/dL — ABNORMAL HIGH (ref 0.44–1.00)
GFR calc Af Amer: 48 mL/min — ABNORMAL LOW (ref >60)
GFR calc non Af Amer: 41 mL/min — ABNORMAL LOW (ref >60)
GLUCOSE: 225 mg/dL — AB (ref 65–99)
POTASSIUM: 3.9 mmol/L (ref 3.5–5.1)
SODIUM: 140 mmol/L (ref 135–145)
Total Bilirubin: 0.9 mg/dL (ref 0.3–1.2)
Total Protein: 7.2 g/dL (ref 6.5–8.1)

## 2016-10-27 LAB — PHOSPHORUS: PHOSPHORUS: 1.4 mg/dL — AB (ref 2.5–4.6)

## 2016-10-27 LAB — BASIC METABOLIC PANEL
Anion gap: 14 (ref 5–15)
BUN: 20 mg/dL (ref 6–20)
CHLORIDE: 104 mmol/L (ref 101–111)
CO2: 26 mmol/L (ref 22–32)
CREATININE: 1.26 mg/dL — AB (ref 0.44–1.00)
Calcium: 8 mg/dL — ABNORMAL LOW (ref 8.9–10.3)
GFR calc non Af Amer: 50 mL/min — ABNORMAL LOW (ref >60)
GFR, EST AFRICAN AMERICAN: 57 mL/min — AB (ref >60)
Glucose, Bld: 116 mg/dL — ABNORMAL HIGH (ref 65–99)
Potassium: 2.9 mmol/L — ABNORMAL LOW (ref 3.5–5.1)
Sodium: 144 mmol/L (ref 135–145)

## 2016-10-27 LAB — BLOOD GAS, ARTERIAL
Acid-Base Excess: 6.2 mmol/L — ABNORMAL HIGH (ref 0.0–2.0)
Bicarbonate: 28.3 mmol/L — ABNORMAL HIGH (ref 20.0–28.0)
Drawn by: 448981
FIO2: 100
O2 Saturation: 100 %
PEEP: 5 cmH2O
Patient temperature: 100.9
RATE: 28 resp/min
VT: 430 mL
pCO2 arterial: 29.2 mmHg — ABNORMAL LOW (ref 32.0–48.0)
pH, Arterial: 7.597 — ABNORMAL HIGH (ref 7.350–7.450)
pO2, Arterial: 335 mmHg — ABNORMAL HIGH (ref 83.0–108.0)

## 2016-10-27 LAB — MRSA PCR SCREENING: MRSA by PCR: NEGATIVE

## 2016-10-27 LAB — PROTIME-INR
INR: 1.1
PROTHROMBIN TIME: 14.3 s (ref 11.4–15.2)

## 2016-10-27 LAB — CREATININE, SERUM
Creatinine, Ser: 1.48 mg/dL — ABNORMAL HIGH (ref 0.44–1.00)
GFR calc non Af Amer: 41 mL/min — ABNORMAL LOW (ref >60)
GFR, EST AFRICAN AMERICAN: 47 mL/min — AB (ref >60)

## 2016-10-27 LAB — TRIGLYCERIDES: TRIGLYCERIDES: 79 mg/dL (ref <150)

## 2016-10-27 LAB — TROPONIN I
TROPONIN I: 0.06 ng/mL — AB (ref <0.03)
TROPONIN I: 0.04 ng/mL — AB (ref <0.03)
Troponin I: 0.04 ng/mL (ref <0.03)

## 2016-10-27 LAB — MAGNESIUM: Magnesium: 1.4 mg/dL — ABNORMAL LOW (ref 1.7–2.4)

## 2016-10-27 LAB — LACTIC ACID, PLASMA
LACTIC ACID, VENOUS: 7.8 mmol/L — AB (ref 0.5–1.9)
Lactic Acid, Venous: 7.1 mmol/L (ref 0.5–1.9)

## 2016-10-27 MED ORDER — ORAL CARE MOUTH RINSE
15.0000 mL | OROMUCOSAL | Status: DC
  Administered 2016-10-28 – 2016-10-31 (×32): 15 mL via OROMUCOSAL

## 2016-10-27 MED ORDER — VANCOMYCIN HCL 500 MG IV SOLR
500.0000 mg | Freq: Two times a day (BID) | INTRAVENOUS | Status: DC
  Administered 2016-10-27 – 2016-10-28 (×2): 500 mg via INTRAVENOUS
  Filled 2016-10-27 (×5): qty 500

## 2016-10-27 MED ORDER — ACETAMINOPHEN 160 MG/5ML PO SOLN
600.0000 mg | Freq: Four times a day (QID) | ORAL | Status: DC | PRN
  Administered 2016-10-27: 600 mg
  Filled 2016-10-27: qty 20.3

## 2016-10-27 MED ORDER — CHLORHEXIDINE GLUCONATE 0.12% ORAL RINSE (MEDLINE KIT)
15.0000 mL | Freq: Two times a day (BID) | OROMUCOSAL | Status: DC
  Administered 2016-10-27 – 2016-10-30 (×7): 15 mL via OROMUCOSAL

## 2016-10-27 MED ORDER — HYDRALAZINE HCL 20 MG/ML IJ SOLN
10.0000 mg | INTRAMUSCULAR | Status: DC | PRN
  Administered 2016-10-27 – 2016-10-29 (×4): 10 mg via INTRAVENOUS
  Filled 2016-10-27 (×4): qty 1

## 2016-10-27 MED ORDER — SODIUM CHLORIDE 0.9 % IV SOLN
3.0000 g | Freq: Three times a day (TID) | INTRAVENOUS | Status: DC
  Administered 2016-10-27 – 2016-10-30 (×12): 3 g via INTRAVENOUS
  Filled 2016-10-27 (×15): qty 3

## 2016-10-27 MED ORDER — ORAL CARE MOUTH RINSE
15.0000 mL | Freq: Four times a day (QID) | OROMUCOSAL | Status: DC
  Administered 2016-10-27 (×2): 15 mL via OROMUCOSAL

## 2016-10-27 MED ORDER — CHLORHEXIDINE GLUCONATE 0.12% ORAL RINSE (MEDLINE KIT)
15.0000 mL | Freq: Two times a day (BID) | OROMUCOSAL | Status: DC
  Administered 2016-10-27 (×2): 15 mL via OROMUCOSAL

## 2016-10-27 MED ORDER — ROCURONIUM BROMIDE 50 MG/5ML IV SOLN
50.0000 mg | Freq: Once | INTRAVENOUS | Status: AC
  Administered 2016-10-27: 50 mg via INTRAVENOUS
  Filled 2016-10-27: qty 5

## 2016-10-27 MED ORDER — POTASSIUM CHLORIDE 20 MEQ/15ML (10%) PO SOLN
40.0000 meq | Freq: Once | ORAL | Status: AC
  Administered 2016-10-27: 40 meq via ORAL
  Filled 2016-10-27: qty 30

## 2016-10-27 MED FILL — Medication: Qty: 1 | Status: AC

## 2016-10-27 NOTE — Progress Notes (Addendum)
PULMONARY / CRITICAL CARE MEDICINE   Name: Melanie [name] MRN: [id number] DOB: June 05, 1966    ADMISSION DATE:  11/15/2016 CONSULTATION DATE:  11/6  REFERRING MD:  Dr. Silverio LayYao EDP  CHIEF COMPLAINT:  Cardiac arrest  HISTORY OF PRESENT ILLNESS:  Patient is encephalopathic and/or intubated. Therefore history has been obtained from chart review. 50 year old female with known history of DM, bipolar, shizophrenia, and polysubstance abuse (heroin, cocaine) found down at home with an unwitnessed cardiac arrest. Last known well 30 minutes prior while speaking with boyfriend telephone. Upon EMS arrival she was found to be in asystole and ACLS protocol was initiated, and continued for 22 minutes. She briefly regained pulses and lost him again with ventricular fibrillation, which responded to epinephrine and defibrillation. She remained bradycardic and was paced in route to the emergency room. Upon arrival to the emergency department she was placed on ventilator and started on epinephrine infusion. Neurologic exam remained poor with pupils fixed and dilated, comatose, and no cough/gag. Pulmonary critical care asked to admit.  SUBJECTIVE:  No events overnight, completely unresponsive with no respiratory drive.  VITAL SIGNS: BP (!) 154/85   Pulse 70   Temp (!) 100.9 F (38.3 C) (Oral)   Resp (!) 28   Ht 5\' 7"  (1.702 m)   Wt 68.1 kg (150 lb 2.1 oz)   LMP  (LMP Unknown)   SpO2 100%   BMI 23.51 kg/m   HEMODYNAMICS:    VENTILATOR SETTINGS: Vent Mode: PRVC FiO2 (%):  [40 %-60 %] 40 % Set Rate:  [20 bmp-28 bmp] 28 bmp Vt Set:  [430 mL] 430 mL PEEP:  [5 cmH20] 5 cmH20 Plateau Pressure:  [18 cmH20-23 cmH20] 19 cmH20  INTAKE / OUTPUT: I/O last 3 completed shifts: In: 1822.1 [I.V.:1512.1; NG/GT:60; IV Piggyback:250] Out: 1525 [Urine:1375; Emesis/NG output:150]  PHYSICAL EXAMINATION: General:  Thin malnourished female on ventilator Neuro:  Comatose, no cough, no gag, pupils fixed, dilated HEENT:   /AT, no appreciable JVD Cardiovascular:  RRR, no MRG Lungs:  Coarse bilaterally Abdomen:  Soft, non-distended Musculoskeletal:  No acute deformity Skin:  Grossly intact  LABS:  BMET  Recent Labs Lab 10/27/16 0053  NA 140  K 3.9  CL 105  CO2 17*  BUN 19  CREATININE 1.46*  1.48*  GLUCOSE 225*   Electrolytes  Recent Labs Lab 10/27/16 0053  CALCIUM 8.3*   CBC  Recent Labs Lab 10/25/2016 2006 10/27/16 0053 10/27/16 0357  WBC 24.2* 16.7* 20.7*  HGB 11.7* 13.8 14.3  HCT 36.5 40.6 42.0  PLT 237 129* 123*   Coag's  Recent Labs Lab 10/30/2016 2006 10/27/16 0053  APTT 37*  --   INR 1.28 1.10   Sepsis Markers  Recent Labs Lab 10/27/16 0053 10/27/16 0357  LATICACIDVEN 7.1* 7.8*   ABG  Recent Labs Lab 10/24/2016 2025 10/27/2016 2309  PHART 7.057* 7.395  PCO2ART 58.3* 32.2  PO2ART 224.0* 327.0*   Liver Enzymes  Recent Labs Lab 10/27/16 0053  AST 312*  ALT 180*  ALKPHOS 87  BILITOT 0.9  ALBUMIN 3.4*   Cardiac Enzymes  Recent Labs Lab 10/27/16 0053  TROPONINI 0.04*  0.04*   Glucose  Recent Labs Lab 10/27/16 0411 10/27/16 0728  GLUCAP 147* 103*   Imaging Ct Head Wo Contrast  Result Date: 10/21/2016 CLINICAL DATA:  Patient was found down and in asystole. Cardiac arrest. 22 minutes CPR. Patient is intubated. Pupils are non reactive. EXAM: CT HEAD WITHOUT CONTRAST TECHNIQUE: Contiguous axial images were obtained from the base of  the skull through the vertex without intravenous contrast. COMPARISON:  None. FINDINGS: Brain: No mass effect or midline shift. Gray-white matter junctions are distinct. Basal cisterns are not effaced. Possible low-attenuation change in the left cerebellum, midbrain, and posterior occipital regions bilaterally. This could represent early watershed ischemic change. No abnormal extra-axial fluid collections. No acute intracranial hemorrhage. No ventricular dilatation. Vascular: No hyperdense vessel or unexpected  calcification. Skull: Normal. Negative for fracture or focal lesion. Sinuses/Orbits: No acute finding. Other: None. IMPRESSION: Nonspecific vague low-attenuation changes suggested in the posterior occipital regions, midbrain, and left cerebellum possibly indicating early watershed ischemic change. Suggest follow-up as clinically indicated. Gray-white matter junctions remain distinct. No ventricular dilatation. No acute intracranial hemorrhage. Electronically Signed   By: Burman NievesWilliam  Stevens M.D.   On: 11/14/2016 23:54   Dg Chest Port 1 View  Result Date: 10/27/2016 CLINICAL DATA:  Evaluate for aspiration. EXAM: PORTABLE CHEST 1 VIEW COMPARISON:  10/25/2016 . FINDINGS: Sites inversus again noted. Endotracheal tube tip 1.6 cm above the carina. NG tube noted with tip below hemidiaphragms. Heart size normal. Low lung volumes with mild basilar atelectasis. Mild bibasilar infiltrates cannot be excluded. No pleural effusion or pneumothorax. IMPRESSION: 1. Endotracheal tube noted with tip 1.6 cm above the carina. NG tube noted with tip below the hemidiaphragm. 2. Status of versus again noted. 3. Bibasilar subsegmental atelectasis. Mild bibasilar infiltrates cannot be excluded . Electronically Signed   By: Maisie Fushomas  Register   On: 10/27/2016 09:09   Dg Chest Portable 1 View  Result Date: 10/24/2016 CLINICAL DATA:  50 y/o  F; status post CPR and intubation. EXAM: PORTABLE CHEST 1 VIEW COMPARISON:  None. FINDINGS: Situs inversus and dextro cardia. Transcutaneous pacing pads are on left hemi thorax. Enteric tube coilsin the right upper quadrant stomach with tip below the field of view in the abdomen. Endotracheal tube tip is at the carina on the first chest radiograph and on the third has been retracted 2.5 cm from the carina. Ill-defined left cardiophrenic angle opacity. No acute osseous abnormality is evident. IMPRESSION: Situs inversus and dextrocardia. Endotracheal tube tip is at the carina on the first chest radiograph  and on the third has been retracted 2.5 cm from the carina. Ill-defined left basilar opacity. Electronically Signed   By: Mitzi HansenLance  Furusawa-Stratton M.D.   On: 11/12/2016 21:00     STUDIES:  CT head >>>  CULTURES: Blood 11/6 >  ANTIBIOTICS: Unasyn 11/6  SIGNIFICANT EVENTS: 11/6 cardiac arrest  LINES/TUBES: ETT 11/6 >  DISCUSSION: 50 year old female with known psychiatric history and history of polysubstance abuse. Suffered an unwitnessed cardiac arrest with potentially 30 minutes of downtime prior to being found. Once found EMS was called and responded with at least 22 minutes of CPR. Initial rhythm asystole. Now his hemodynamic stable off pressors, but is on ventilator with very poor neurologic exam. Admit to ICU for supportive care and further diagnostic workup. Not a candidate for targeted temperature management and therapy.  ASSESSMENT / PLAN:  PULMONARY A: Inability to protect airway status post cardiac arrest  P:   Full vent support If EEG is negative will perform an apnea test Follow ABG CXR in AM VAP bundle Exchange ETT for a larger one to be able to pass suction catheter, currently unable.  CARDIOVASCULAR A:  Asystolic cardiac arrest 45mins, suspect in setting overdose. Situs inversus, dextrocardia  P:  Telemetry monitoring No hypothermia started. D/C pressors Echocardiogram pending  RENAL A:   Cr elevated slightly but UOP is adequate.  P:  Follow renal function and urine output Correct electrolytes as indicated BMET now and in AM.  GASTROINTESTINAL A:   No acute issues  P:   NPO Pepcid for SUP  HEMATOLOGIC A:   No acute issues P: Follow CBC Subcutaneous heparin SCDs  INFECTIOUS A:   Leukocytosis - possibly in setting of aspiration, although no clear evidence of this  P:   Follow cultures  Unasyn for possible aspiration pneumonia  ENDOCRINE A:   Diabetes mellitus  P:   CBG monitoring  Sliding-scale insulin  NEUROLOGIC A:     Acute anoxic encephalopathy  P:   RASS goal: 0 to -1 D/C all sedation. CT head pending EEG with burst suppression Avoid fevers Neurology consult called  FAMILY  - Updates: No family bedside, mother updated over the phone and patient made DNR and family is coming for likely withdrawal.  - Inter-disciplinary family meet or Palliative Care meeting due by:  11/12  The patient is critically ill with multiple organ systems failure and requires high complexity decision making for assessment and support, frequent evaluation and titration of therapies, application of advanced monitoring technologies and extensive interpretation of multiple databases.   Critical Care Time devoted to patient care services described in this note is  35  Minutes. This time reflects time of care of this signee Dr Koren Bound. This critical care time does not reflect procedure time, or teaching time or supervisory time of PA/NP/Med student/Med Resident etc but could involve care discussion time.  Alyson Reedy, M.D. [facility] Pulmonary/Critical Care Medicine. Pager: [phone]. After hours pager: [phone].

## 2016-10-27 NOTE — Progress Notes (Signed)
eLink Physician-Brief Progress Note Patient Name: Melanie [name] DOB: 1966-04-19 MRN: [id number]   Date of Service  10/27/2016  HPI/Events of Note  Vent dyssynchrony  eICU Interventions  Changed to PCV mode with improved synchrony     Intervention Category Major Interventions: Respiratory failure - evaluation and management  Merwyn KatosDavid B Haider Hornaday 10/27/2016, 8:22 PM

## 2016-10-27 NOTE — ED Notes (Signed)
[facility] phone numbers: [phone]

## 2016-10-27 NOTE — Procedures (Signed)
Intubation Procedure Note Melanie [name] [id number] [birth year]  Procedure: Intubation Indications: Respiratory insufficiency and tube changed  Procedure Details Consent: Risks of procedure as well as the alternatives and risks of each were explained to the (patient/caregiver).  Consent for procedure obtained. Time Out: Verified patient identification, verified procedure, site/side was marked, verified correct patient position, special equipment/implants available, medications/allergies/relevent history reviewed, required imaging and test results available.  Performed  MAC and 3 Medications:  Fentanyl  Etomidate 20 mg Versed NMB rocuronium 50 mg   Evaluation Hemodynamic Status: BP stable throughout; O2 sats: stable throughout Patient's Current Condition: stable Complications: No apparent complications Patient did tolerate procedure well. Chest X-ray ordered to verify placement.  CXR: pending.   Melanie [name] ACNP Melanie [name] PCCM Pager [phone] till 3 pm If no answer page [phone]  Melanie [name], M.D. [facility] Pulmonary/Critical Care Medicine. Pager: [phone]. After hours pager: [phone].  10/27/2016, 11:20 AM

## 2016-10-27 NOTE — ED Notes (Signed)
Escorted patient to CT and to unit with no difficulties

## 2016-10-27 NOTE — Progress Notes (Addendum)
eLink Physician-Brief Progress Note Patient Name: Melanie GongSue Schnitzer DOB: [birth year] MRN: [id number]   Date of Service  10/27/2016  HPI/Events of Note  Hypertension - BP = 169/52. Pupils Dilated bilaterally - suspect cerebral herniation.   eICU Interventions  Will order: 1. Hydralazine 10 mg IV Q 4 hours PRN SBP > 160 or DBP > 100. 2. Head CT Scan without contrast now.      Intervention Category Intermediate Interventions: Hypertension - evaluation and management  Kaileen Bronkema Eugene 10/27/2016, 11:31 PM

## 2016-10-27 NOTE — Progress Notes (Signed)
eLink Physician-Brief Progress Note Patient Name: Melanie GongSue Hoeger DOB: [birth year] MRN: [id number]   Date of Service  10/27/2016  HPI/Events of Note  Patient intubated and critically ill.   eICU Interventions  Order placed for Foley insertion      Intervention Category Minor Interventions: Other:  Lawanda CousinsJennings Nicoya Friel 10/27/2016, 1:06 AM

## 2016-10-27 NOTE — Progress Notes (Signed)
EEG Completed; Results Pending  

## 2016-10-27 NOTE — Progress Notes (Signed)
ELink notified of elevated lactic acid and troponin.

## 2016-10-27 NOTE — Progress Notes (Addendum)
PHARMACY - PHYSICIAN COMMUNICATION CRITICAL VALUE ALERT - BLOOD CULTURE IDENTIFICATION (BCID)  Results for orders placed or performed during the hospital encounter of 10/30/2016  Blood Culture ID Panel (Reflexed) (Collected: 10/22/2016  8:07 PM)  Result Value Ref Range   Enterococcus species NOT DETECTED NOT DETECTED   Listeria monocytogenes NOT DETECTED NOT DETECTED   Staphylococcus species DETECTED (A) NOT DETECTED   Staphylococcus aureus NOT DETECTED NOT DETECTED   Methicillin resistance DETECTED (A) NOT DETECTED   Streptococcus species NOT DETECTED NOT DETECTED   Streptococcus agalactiae NOT DETECTED NOT DETECTED   Streptococcus pneumoniae NOT DETECTED NOT DETECTED   Streptococcus pyogenes NOT DETECTED NOT DETECTED   Acinetobacter baumannii NOT DETECTED NOT DETECTED   Enterobacteriaceae species NOT DETECTED NOT DETECTED   Enterobacter cloacae complex NOT DETECTED NOT DETECTED   Escherichia coli NOT DETECTED NOT DETECTED   Klebsiella oxytoca NOT DETECTED NOT DETECTED   Klebsiella pneumoniae NOT DETECTED NOT DETECTED   Proteus species NOT DETECTED NOT DETECTED   Serratia marcescens NOT DETECTED NOT DETECTED   Haemophilus influenzae NOT DETECTED NOT DETECTED   Neisseria meningitidis NOT DETECTED NOT DETECTED   Pseudomonas aeruginosa NOT DETECTED NOT DETECTED   Candida albicans NOT DETECTED NOT DETECTED   Candida glabrata NOT DETECTED NOT DETECTED   Candida krusei NOT DETECTED NOT DETECTED   Candida parapsilosis NOT DETECTED NOT DETECTED   Candida tropicalis NOT DETECTED NOT DETECTED    Name of physician (or Provider) Contacted: Dr. Sung AmabileSimonds  Changes to prescribed antibiotics required: Vancomycin pending identification per MD  Melanie Moneyony L Shaquel Josephson 10/27/2016  8:08 PM

## 2016-10-27 NOTE — Progress Notes (Signed)
eLink Physician-Brief Progress Note Patient Name: Melanie GongSue Goudeau DOB: June 01, 1966 MRN: [id number]   Date of Service  10/27/2016  HPI/Events of Note  High fever post cardiac arrest. Poor prognosis  eICU Interventions  Cooling blanket ordered     Intervention Category Major Interventions: Other:  Merwyn KatosDavid B Simonds 10/27/2016, 5:22 PM

## 2016-10-27 NOTE — Procedures (Signed)
ELECTROENCEPHALOGRAM REPORT  Date of Study: 10/27/2016  Patient's Name: Melanie [name] MRN: [id number] Date of Birth: 09-Jul-1966  Referring Provider: Georgann Housekeeper, NP  Clinical History: 50 year old female with known history of DM, bipolar, shizophrenia, and polysubstance abuse (heroin, cocaine) found down at home with an unwitnessed cardiac arrest. Last known well 30 minutes prior while speaking with boyfriend telephone  Medications:  0.9 % sodium chloride infusion   0.9 % sodium chloride infusion   Ampicillin-Sulbactam (UNASYN) 3 g in sodium chloride 0.9 % 100 mL IVPB   chlorhexidine gluconate (MEDLINE KIT) (PERIDEX) 0.12 % solution 15 mL   famotidine (PEPCID) IVPB 20 mg premix   fentaNYL (SUBLIMAZE) injection 50 mcg   fentaNYL (SUBLIMAZE) injection 50 mcg   heparin injection 5,000 Units   insulin aspart (novoLOG) injection 2-6 Units   MEDLINE mouth rinse   propofol (DIPRIVAN) 1000 MG/100ML infusion   sodium bicarbonate 150 mEq in sterile water 1,000 mL infusion   Technical Summary: A multichannel digital EEG recording measured by the international 10-20 system with electrodes applied with paste and impedances below 5000 ohms performed in our laboratory with EKG monitoring in an intubated patient.  Sedation was turned off 1 hour prior to study.  Hyperventilation and photic stimulation were not performed.  The digital EEG was referentially recorded, reformatted, and digitally filtered in a variety of bipolar and referential montages for optimal display.    Description: The patient is intubated and unresponsive, without sedation during the recording. There is loss of normal background activity. The records read at a sensitivity of 3 uV/mm shows diffuse suppression of background activity every 5 to 8 seconds between 1 to 2 second bursts of bilateral periodic sharp waves. There is no spontaneous reactivity or reactivity noted with noxious stimulation. There is muscle artifact over the frontal  leads. Hyperventilation and photic stimulation were not performed. There were no epileptiform discharges or electrographic seizures seen.   EKG lead shows occasional PVCs.  Impression: This EEG is markedly abnormal due to burst-suppression pattern with intermittent bilateral periodic sharp waves.   Clinical Correlation: This record shows evidence of severe diffuse or bilateral cerebral dysfunction which more likely reflects the patient's history of anoxic brain injury as opposed to ictal epileptiform activity. In the absence of CNS active, sedating, or anesthetic medications, this suggests a poor prognosis. Clinical correlation is advised.  Nghia Mcentee R. Tomi Likens, DO

## 2016-10-27 NOTE — Progress Notes (Signed)
RT note: Assisted NP Brett CanalesSteve Minor with tube exchange to a 7.5 cuffed with sub glottic. Positive easy cap verified and direct visualization with use of video Low profile #3 Glide Scope> cxr pending tube placement.

## 2016-10-27 NOTE — Progress Notes (Signed)
Pharmacy Antibiotic Note  Melanie [name] is a 50 y.o. female admitted on 11/03/2016 s/p unwitnessed cardiac arrest. Pharmacy has been consulted for Unasyn dosing in setting of leukocytosis and possible asp pna.  Plan: Unasyn 3gm IV q8h Will f/u micro data, renal function, and pt's clinical condition  Height: 5\' 7"  (170.2 cm) Weight: 150 lb 2.1 oz (68.1 kg) IBW/kg (Calculated) : 61.6  Temp (24hrs), Avg:95.1 F (35.1 C), Min:92.7 F (33.7 C), Max:97.4 F (36.3 C)   Recent Labs Lab 11/04/2016 2006 10/27/16 0053  WBC 24.2* 16.7*  CREATININE  --  1.46*  1.48*  LATICACIDVEN  --  7.1*    Estimated Creatinine Clearance: 45.2 mL/min (by C-G formula based on SCr of 1.48 mg/dL (H)).    Not on File  Antimicrobials this admission: 11/6 Unasyn >>   Microbiology results: 11/6 BCx x2:  11/7 MRSA PCR:   Thank you for allowing pharmacy to be a part of this patient's care.  Melanie [name], PharmD, BCPS Clinical pharmacist, pager [phone] 10/27/2016 2:39 AM

## 2016-10-27 NOTE — Progress Notes (Signed)
Admission assessment completed to best of RN's ability with daughter Unique's assistance.

## 2016-10-27 NOTE — Progress Notes (Signed)
eLink Physician-Brief Progress Note Patient Name: Melanie GongSue Casalino DOB: 06/05/1966 MRN: [id number]   Date of Service  10/27/2016  HPI/Events of Note  1 of 2 blood cultures positive for methicillin resistant staph, not aureus. Likely contaminant  eICU Interventions  Vanc per pharmacy until blood culture results are finalized     Intervention Category Intermediate Interventions: Infection - evaluation and management  Merwyn KatosDavid B Simonds 10/27/2016, 8:56 PM

## 2016-10-27 NOTE — Consult Note (Signed)
Neurology Consultation Reason for Consult: Anoxic brain injury Referring Physician: PCCM  CC: anoxic brain injury  History is obtained from:chart and other providers.  HPI: Melanie [name] is a 50 y.o. female with hx of Hep C, schizophrenia, polysubstance abuse (cocaine, heroin), endocarditis, DM II, who was found down at home with unwitnessed cardiac arrest 11/6. Last known well was 30 mins prior while speaking with boyfriend on the phone. Was found to be in asystole, ACLS done for 22 mins with ROSC then lost pulse with Vfib, and was resuscitated again after total 45 mins. Was intubated. Was comatose, no cough/gag, pupils fixed and dilated. Drug overdose was thought to be the culprit. Patient remains intubated now.   EEG showed burst-suppression pattern and intermittent b/l periodic sharp waves.   ROS:  Unable to obtain due to altered mental status.   Past Medical History:  Diagnosis Date  . Bipolar 1 disorder (HCC)   . Chronic pain   . DM (diabetes mellitus) (HCC)   . Endocarditis   . Hepatitis C   . Intravenous drug abuse   . Polysubstance abuse   . Schizophrenia (HCC)     No family history on file.   Social History:  has no tobacco, alcohol, and drug history on file.   Exam: Current vital signs: BP (!) 151/76   Pulse (!) 41   Temp (!) 100.9 F (38.3 C) (Oral)   Resp (!) 28   Ht 5\' 7"  (1.702 m)   Wt 150 lb 2.1 oz (68.1 kg)   LMP  (LMP Unknown)   SpO2 100%   BMI 23.51 kg/m  Vital signs in last 24 hours: Temp:  [92.7 F (33.7 C)-100.9 F (38.3 C)] 100.9 F (38.3 C) (11/07 0800) Pulse Rate:  [39-117] 41 (11/07 1030) Resp:  [8-32] 28 (11/07 1030) BP: (67-173)/(40-119) 151/76 (11/07 1030) SpO2:  [99 %-100 %] 100 % (11/07 1030) FiO2 (%):  [40 %-60 %] 40 % (11/07 0800) Weight:  [150 lb 2.1 oz (68.1 kg)-160 lb (72.6 kg)] 150 lb 2.1 oz (68.1 kg) (11/07 0240)   Physical Exam   Exam done after patient received paralytic earlier this morning.  Does not open eyes or  follow commands. No response to noxious stimuli on any extremities. No cough or gag. GCS 3. No dolls eye. No corneal reflex. Very sluggish pupillary response bilaterally. Pupils are equal.  Babinski equivocal bilaterally. Some tongue fasciculation seen.    I have reviewed labs in epic and the results pertinent to this consultation are: WBC 20.7. UDS + opiate. abnl LFT.   I have reviewed the images obtained: CT head, EEG.   Impression:   50 yo female here with anoxic encephalopathy s/p cardiac arrest for 45 mins. Currently is comatosed, only has some pupillary response. EEG showed burst-suppression pattern that's consistent with anoxic brain injury and suggests a very poor prognosis.   Recommendations: 1) suggest end of life care discussion with family as it is likely that she will not recover to have a meaningful functionality from this. Her prognosis is very poor.  2) could consider repeating EEG and repeating CT head tomorrow.   Hyacinth Meekerasrif Kaleeya Hancock, MD, PGY-3.

## 2016-10-27 NOTE — Progress Notes (Signed)
Pharmacy Antibiotic Note  Melanie [name] Tilly is a 50 y.o. female admitted on 10/24/2016 with bacteremia.  Pharmacy has been consulted for vancomycin dosing.  BCID positive for staph species with resistance marker. MD wanted to treat until sensitivity identified.  Plan: Vancomycin 500 mg IV every 12 hours.  Goal trough 15-20 mcg/mL.  Height: 5\' 7"  (170.2 cm) Weight: 150 lb 2.1 oz (68.1 kg) IBW/kg (Calculated) : 61.6  Temp (24hrs), Avg:99.9 F (37.7 C), Min:92.7 F (33.7 C), Max:104.6 F (40.3 C)   Recent Labs Lab 10/28/2016 2006 10/27/16 0053 10/27/16 0357 10/27/16 1057  WBC 24.2* 16.7* 20.7*  --   CREATININE  --  1.46*  1.48*  --  1.26*  LATICACIDVEN  --  7.1* 7.8*  --     Estimated Creatinine Clearance: 53.1 mL/min (by C-G formula based on SCr of 1.26 mg/dL (H)).    Not on File  Thank you for allowing pharmacy to be a part of this patient's care.  Melanie [name] 10/27/2016 8:14 PM

## 2016-10-28 ENCOUNTER — Inpatient Hospital Stay (HOSPITAL_COMMUNITY): Payer: Medicaid Other

## 2016-10-28 DIAGNOSIS — G935 Compression of brain: Secondary | ICD-10-CM

## 2016-10-28 LAB — BLOOD GAS, ARTERIAL
ACID-BASE EXCESS: 2 mmol/L (ref 0.0–2.0)
Acid-Base Excess: 4.4 mmol/L — ABNORMAL HIGH (ref 0.0–2.0)
BICARBONATE: 24.9 mmol/L (ref 20.0–28.0)
Bicarbonate: 26.7 mmol/L (ref 20.0–28.0)
Drawn by: 448981
Drawn by: 345601
FIO2: 50
FIO2: 30
LHR: 16 {breaths}/min
O2 SAT: 98.4 %
O2 Saturation: 99.5 %
PATIENT TEMPERATURE: 98.6
PCO2 ART: 32.2 mmHg (ref 32.0–48.0)
PCO2 ART: 30.9 mmHg — AB (ref 32.0–48.0)
PEEP/CPAP: 5 cmH2O
PEEP: 5 cmH2O
PH ART: 7.517 — AB (ref 7.350–7.450)
PRESSURE CONTROL: 15 cmH2O
Patient temperature: 103.1
RATE: 20 resp/min
VT: 430 mL
pH, Arterial: 7.539 — ABNORMAL HIGH (ref 7.350–7.450)
pO2, Arterial: 199 mmHg — ABNORMAL HIGH (ref 83.0–108.0)
pO2, Arterial: 118 mmHg — ABNORMAL HIGH (ref 83.0–108.0)

## 2016-10-28 LAB — POCT I-STAT 3, ART BLOOD GAS (G3+)
Acid-Base Excess: 1 mmol/L (ref 0.0–2.0)
BICARBONATE: 23.5 mmol/L (ref 20.0–28.0)
O2 SAT: 100 %
PCO2 ART: 30 mmHg — AB (ref 32.0–48.0)
PO2 ART: 171 mmHg — AB (ref 83.0–108.0)
Patient temperature: 98.5
TCO2: 24 mmol/L (ref 0–100)
pH, Arterial: 7.502 — ABNORMAL HIGH (ref 7.350–7.450)

## 2016-10-28 LAB — GLUCOSE, CAPILLARY
GLUCOSE-CAPILLARY: 106 mg/dL — AB (ref 65–99)
GLUCOSE-CAPILLARY: 102 mg/dL — AB (ref 65–99)
Glucose-Capillary: 120 mg/dL — ABNORMAL HIGH (ref 65–99)
Glucose-Capillary: 124 mg/dL — ABNORMAL HIGH (ref 65–99)
Glucose-Capillary: 126 mg/dL — ABNORMAL HIGH (ref 65–99)
Glucose-Capillary: 142 mg/dL — ABNORMAL HIGH (ref 65–99)

## 2016-10-28 LAB — PHOSPHORUS: PHOSPHORUS: 2.7 mg/dL (ref 2.5–4.6)

## 2016-10-28 LAB — CBC
HEMATOCRIT: 40.1 % (ref 36.0–46.0)
HEMOGLOBIN: 13.6 g/dL (ref 12.0–15.0)
MCH: 29.1 pg (ref 26.0–34.0)
MCHC: 33.9 g/dL (ref 30.0–36.0)
MCV: 85.7 fL (ref 78.0–100.0)
Platelets: 134 10*3/uL — ABNORMAL LOW (ref 150–400)
RBC: 4.68 MIL/uL (ref 3.87–5.11)
RDW: 13.5 % (ref 11.5–15.5)
WBC: 19 10*3/uL — AB (ref 4.0–10.5)

## 2016-10-28 LAB — BASIC METABOLIC PANEL
ANION GAP: 12 (ref 5–15)
BUN: 15 mg/dL (ref 6–20)
CALCIUM: 7.4 mg/dL — AB (ref 8.9–10.3)
CHLORIDE: 111 mmol/L (ref 101–111)
CO2: 22 mmol/L (ref 22–32)
Creatinine, Ser: 0.94 mg/dL (ref 0.44–1.00)
GFR calc non Af Amer: >60 mL/min (ref >60)
GLUCOSE: 122 mg/dL — AB (ref 65–99)
Potassium: 2.5 mmol/L — CL (ref 3.5–5.1)
Sodium: 145 mmol/L (ref 135–145)

## 2016-10-28 LAB — MAGNESIUM: Magnesium: 1.6 mg/dL — ABNORMAL LOW (ref 1.7–2.4)

## 2016-10-28 LAB — POTASSIUM: Potassium: 3.8 mmol/L (ref 3.5–5.1)

## 2016-10-28 MED ORDER — POTASSIUM CHLORIDE 20 MEQ/15ML (10%) PO SOLN
ORAL | Status: AC
  Filled 2016-10-28: qty 30

## 2016-10-28 MED ORDER — VANCOMYCIN HCL IN DEXTROSE 750-5 MG/150ML-% IV SOLN
750.0000 mg | Freq: Two times a day (BID) | INTRAVENOUS | Status: DC
  Filled 2016-10-28: qty 150

## 2016-10-28 MED ORDER — MAGNESIUM SULFATE 2 GM/50ML IV SOLN
2.0000 g | Freq: Once | INTRAVENOUS | Status: AC
  Administered 2016-10-28: 2 g via INTRAVENOUS
  Filled 2016-10-28: qty 50

## 2016-10-28 MED ORDER — LABETALOL HCL 5 MG/ML IV SOLN
INTRAVENOUS | Status: AC
  Administered 2016-10-28: 10 mg via INTRAVENOUS
  Filled 2016-10-28: qty 4

## 2016-10-28 MED ORDER — LABETALOL HCL 5 MG/ML IV SOLN
10.0000 mg | INTRAVENOUS | Status: DC | PRN
  Administered 2016-10-28 (×3): 10 mg via INTRAVENOUS
  Filled 2016-10-28 (×2): qty 4

## 2016-10-28 MED ORDER — POTASSIUM CHLORIDE 20 MEQ/15ML (10%) PO SOLN
60.0000 meq | Freq: Once | ORAL | Status: AC
  Administered 2016-10-28: 60 meq via ORAL
  Filled 2016-10-28: qty 45

## 2016-10-28 NOTE — Progress Notes (Signed)
Upon assessment at 0000, patient blood pressure 191/158, recheck at 0003 was 177/89. Right pupil is a 4 and nonreactive, Left Pupil is a 6 and nonreactive. Patient right and left arm in posturing position. Pola CornELINK, MD [facility] called and notified. Orders for Stat CT given.

## 2016-10-28 NOTE — Progress Notes (Signed)
eLink Physician-Brief Progress Note Patient Name: Melanie GongSue [name] DOB: 10-21-1966 MRN: [id number]   Date of Service  10/28/2016  HPI/Events of Note  K+ = 2.5 and Creatinine = 0.94.  eICU Interventions  Will replace K+.     Intervention Category Intermediate Interventions: Electrolyte abnormality - evaluation and management  Ali Mohl Eugene 10/28/2016, 4:01 AM

## 2016-10-28 NOTE — Progress Notes (Signed)
CRITICAL VALUE ALERT  Critical value received:  Potassium 2.5   Date of notification:  10/28/2016  Time of notification:  0358  Critical value read back:Yes.    Nurse who received alert:  Owens LofflerKaziah Miller, RN  MD notified (1st page):  Somners, MD   Time of first page:  0400  MD notified (2nd page):  Time of second page:  Responding MD:  Richarda OverlieSomners, MD  Time MD responded:  [id number]

## 2016-10-28 NOTE — Progress Notes (Signed)
PCCM INTERVAL PROGRESS NOTE  Radiology reviewed. CT describes  cerebral edema with effacement of the basal cisterns and cerebellar tonsillar herniation at the foramen magnum. I called family to discuss this. Got in touch with sister Alvis LemmingsDawn (other family members unreachable) and discussed this with her and how it impacts her already very poor prognosis. The patients mother will be flying in 11/8 AM. After she is able to see her we will need to readdress goals of care.   Joneen RoachPaul Jakyria Bleau, AGACNP-BC [facility]/Critical [facility] Pager [phone] or [phone]  10/28/2016 2:03 AM

## 2016-10-28 NOTE — Progress Notes (Signed)
RT transported patient from 2H to CT and back without any complications 

## 2016-10-28 NOTE — Progress Notes (Signed)
Subjective: Blew pupils overnight, still taking some breaths over the vent  Exam: Vitals:   10/28/16 1500 10/28/16 1525  BP: (!) 167/95 (!) 167/95  Pulse: 94 98  Resp: (!) 22 (!) 27  Temp:     Gen: In bed, NAD Resp: non-labored breathing, no acute distress Abd: soft, nt  Neuro: MS: Does not open eyes or follow commands CN: Pupils fixed and dilated, corneals absent, no cough, no doll's eye Motor: Does not respond to noxious stimulation Sensory: As above DTR: Biceps and patellar intact  Pertinent Labs: Hypokalemic  Impression: 50 year old female with devastating anoxic brain injury. The only thing precluding brain death diagnosis at this time is a mild respiratory drive. She has no hope of survival. Once family is here a recommend withdrawal of care.  Recommendations: 1) recommend withdrawal of care, no further recommendations at this time. 2) if respiratory drive ceases, can perform an apnea test for formal declaration of brain death. 3) please call if neurology can be of any further assistance.  Ritta SlotMcNeill Latisa Belay, MD [facility] [phone]  If 7pm- 7am, please page neurology on call as listed in AMION.

## 2016-10-28 NOTE — Progress Notes (Signed)
CDS called and updated regarding patient status.

## 2016-10-28 NOTE — Progress Notes (Signed)
Joneen RoachPaul Hoffman, NP at bedside. Verbal order for ABG given. Awaiting transport for CT.

## 2016-10-28 NOTE — Progress Notes (Signed)
Attempt to call daughter x2. No answer. Sister, Alvis LemmingsDawn, was able to be reached and Joneen RoachPaul Hoffman, NP updated her on patient status and CT scan results.

## 2016-10-28 NOTE — Progress Notes (Signed)
PULMONARY / CRITICAL CARE MEDICINE   Name: Evorn GongSue Cardenas MRN: [id number] DOB: [birth year]    ADMISSION DATE:  11/14/2016 CONSULTATION DATE:  11/6  REFERRING MD:  Dr. Silverio LayYao EDP  CHIEF COMPLAINT:  Cardiac arrest  HISTORY OF PRESENT ILLNESS:  Patient is encephalopathic and/or intubated. Therefore history has been obtained from chart review. 50 year old female with known history of DM, bipolar, shizophrenia, and polysubstance abuse (heroin, cocaine) found down at home with an unwitnessed cardiac arrest. Last known well 30 minutes prior while speaking with boyfriend telephone. Upon EMS arrival she was found to be in asystole and ACLS protocol was initiated, and continued for 22 minutes. She briefly regained pulses and lost him again with ventricular fibrillation, which responded to epinephrine and defibrillation. She remained bradycardic and was paced in route to the emergency room. Upon arrival to the emergency department she was placed on ventilator and started on epinephrine infusion. Neurologic exam remained poor with pupils fixed and dilated, comatose, and no cough/gag. Pulmonary critical care asked to admit.  SUBJECTIVE:  CT with evidence of herniation overnight.  Hypertension overnight.  VITAL SIGNS: BP (!) 147/88   Pulse 88   Temp 98.7 F (37.1 C) (Oral)   Resp 17   Ht 5\' 7"  (1.702 m)   Wt 68.4 kg (150 lb 12.7 oz)   LMP  (LMP Unknown)   SpO2 99%   BMI 23.62 kg/m   HEMODYNAMICS:    VENTILATOR SETTINGS: Vent Mode: PCV FiO2 (%):  [30 %-100 %] 30 % Set Rate:  [20 bmp-28 bmp] 20 bmp Vt Set:  [430 mL] 430 mL PEEP:  [5 cmH20] 5 cmH20 Plateau Pressure:  [17 cmH20-26 cmH20] 17 cmH20  INTAKE / OUTPUT: I/O last 3 completed shifts: In: 5123.3 [I.V.:4263.3; NG/GT:60; IV Piggyback:800] Out: 3760 [Urine:3210; Emesis/NG output:550]  PHYSICAL EXAMINATION: General:  Thin malnourished female on ventilator Neuro:  Comatose, no cough, no gag, pupils fixed and dilated HEENT:  /AT, no  appreciable JVD Cardiovascular:  RRR, no MRG Lungs:  Coarse bilaterally Abdomen:  Soft, non-distended Musculoskeletal:  No acute deformity Skin:  Grossly intact  LABS:  BMET  Recent Labs Lab 10/27/16 0053 10/27/16 1057 10/28/16 0311 10/28/16 0618  NA 140 144 145  --   K 3.9 2.9* 2.5* 3.8  CL 105 104 111  --   CO2 17* 26 22  --   BUN 19 20 15   --   CREATININE 1.46*  1.48* 1.26* 0.94  --   GLUCOSE 225* 116* 122*  --    Electrolytes  Recent Labs Lab 10/27/16 0053 10/27/16 1057 10/28/16 0311  CALCIUM 8.3* 8.0* 7.4*  MG  --  1.4* 1.6*  PHOS  --  1.4* 2.7   CBC  Recent Labs Lab 10/27/16 0053 10/27/16 0357 10/28/16 0311  WBC 16.7* 20.7* 19.0*  HGB 13.8 14.3 13.6  HCT 40.6 42.0 40.1  PLT 129* 123* 134*   Coag's  Recent Labs Lab 11/09/2016 2006 10/27/16 0053  APTT 37*  --   INR 1.28 1.10   Sepsis Markers  Recent Labs Lab 10/27/16 0053 10/27/16 0357  LATICACIDVEN 7.1* 7.8*   ABG  Recent Labs Lab 10/27/16 1345 10/28/16 0042 10/28/16 0350  PHART 7.539* 7.502* 7.517*  PCO2ART 32.2 30.0* 30.9*  PO2ART 199* 171.0* 118*   Liver Enzymes  Recent Labs Lab 10/27/16 0053  AST 312*  ALT 180*  ALKPHOS 87  BILITOT 0.9  ALBUMIN 3.4*   Cardiac Enzymes  Recent Labs Lab 10/27/16 0053 10/27/16 1057  TROPONINI 0.04*  0.04* 0.06*   Glucose  Recent Labs Lab 10/27/16 1155 10/27/16 1610 10/27/16 1951 10/28/16 0002 10/28/16 0407 10/28/16 0746  GLUCAP 127* 139* 121* 142* 106* 120*   Imaging Ct Head Wo Contrast  Result Date: 10/28/2016 CLINICAL DATA:  Takes dilated pupils lobe both eyes EXAM: CT HEAD WITHOUT CONTRAST TECHNIQUE: Contiguous axial images were obtained from the base of the skull through the vertex without intravenous contrast. COMPARISON:  Head CT 11/15/2016 FINDINGS: Brain: There is diffuse cerebral edema with blurring of the gray-white interface. Hyperattenuation of intracranial vessels is consistent with venous congestion due to  increased intracranial pressure (pseudo subarachnoid sign). The basal cisterns are effaced. There is crowding of the foramen magnum with inferior herniation of the cerebellar tonsils. The frontal horns of the lateral ventricles and the atria remain patent. No acute hemorrhage is identified. Vascular: As above, hyper attenuating appearance along the courses of the major cranial vessels secondary to increased intracranial pressure. Skull: Normal. Negative for fracture or focal lesion. Sinuses/Orbits: The visualized portions of the paranasal sinuses and mastoid air cells are free of fluid. No advanced mucosal thickening. The visualized orbits are normal. Other: None IMPRESSION: 1. Diffuse cerebral edema with effacement of the basal cisterns and cerebellar tonsillar herniation at the foramen magnum. 2. No acute hemorrhage. Critical Value/emergent results were called by telephone at the time of interpretation on 10/28/2016 at 1:31 am to Dr. Loletha Grayer , who verbally acknowledged these results. Electronically Signed   By: Deatra Robinson M.D.   On: 10/28/2016 01:31   Dg Chest Port 1 View  Result Date: 10/28/2016 CLINICAL DATA:  Shortness of Breath EXAM: PORTABLE CHEST 1 VIEW COMPARISON:  10/27/2016 FINDINGS: Changes of dextrocardia are again identified and stable. The endotracheal tube is noted approximately 2 cm above the carina. Nasogastric catheter courses into the stomach. The lungs are well aerated bilaterally. No focal infiltrate or sizable effusion is seen. IMPRESSION: Tubes and lines as described above. No acute abnormality seen. Electronically Signed   By: Alcide Clever M.D.   On: 10/28/2016 08:50   Dg Chest Port 1 View  Result Date: 10/27/2016 CLINICAL DATA:  Endotracheal tube exchange.  Respiratory failure. EXAM: PORTABLE CHEST 1 VIEW COMPARISON:  10/27/2016 at 0448 hours FINDINGS: Endotracheal tube terminates approximately 3.5 cm above the carina. Enteric tube courses into the right upper abdomen with  tip not imaged. Situs inversus is again noted. The lungs are better inflated than on the prior study. No confluent airspace opacity, edema, pleural effusion, or pneumothorax is identified. IMPRESSION: 1. Endotracheal tube in satisfactory position. 2. No evidence of active disease. Electronically Signed   By: Sebastian Ache M.D.   On: 10/27/2016 12:42     STUDIES:  CT head >>>  CULTURES: Blood 11/6 >  ANTIBIOTICS: Unasyn 11/6  SIGNIFICANT EVENTS: 11/6 cardiac arrest  LINES/TUBES: ETT 11/6 >  DISCUSSION: 50 year old female with known psychiatric history and history of polysubstance abuse. Suffered an unwitnessed cardiac arrest with potentially 30 minutes of downtime prior to being found. Once found EMS was called and responded with at least 22 minutes of CPR. Initial rhythm asystole. Now his hemodynamic stable off pressors, but is on ventilator with very poor neurologic exam. Admit to ICU for supportive care and further diagnostic workup. Not a candidate for targeted temperature management and therapy.  ASSESSMENT / PLAN:  PULMONARY A: Inability to protect airway status post cardiac arrest  P:   Full vent support Patient has a respiratory drive Follow ABG CXR in AM VAP  bundle Likely comfort care once mother arrives  CARDIOVASCULAR A:  Asystolic cardiac arrest 45mins, suspect in setting overdose. Situs inversus, dextrocardia  P:  Telemetry monitoring No hypothermia started given downtime. Labetalol for BP support Echocardiogram canceled given neuro status  RENAL A:   Cr elevated slightly but UOP is adequate.  P:   Follow renal function and urine output Correct electrolytes as indicated BMET in AM.  GASTROINTESTINAL A:   No acute issues  P:   NPO Pepcid for SUP No TF  HEMATOLOGIC A:   No acute issues P: Follow CBC Subcutaneous heparin SCDs  INFECTIOUS A:   Leukocytosis - possibly in setting of aspiration, although no clear evidence of this  P:    Follow cultures  Unasyn for possible aspiration pneumonia  ENDOCRINE A:   Diabetes mellitus  P:   CBG monitoring  Sliding-scale insulin  NEUROLOGIC A:    Acute anoxic encephalopathy  P:   RASS goal: -5 No sedation. CT head with evidence of herniation. EEG with burst suppression Avoid fevers Neurology consult appreciated  FAMILY  - Updates: No family bedside, waiting for mother to arrive for withdrawal specially after patient herniated yesterday.  DNR status, no further escalation of care.  - Inter-disciplinary family meet or Palliative Care meeting due by:  11/12  The patient is critically ill with multiple organ systems failure and requires high complexity decision making for assessment and support, frequent evaluation and titration of therapies, application of advanced monitoring technologies and extensive interpretation of multiple databases.   Critical Care Time devoted to patient care services described in this note is  35  Minutes. This time reflects time of care of this signee Dr Koren BoundWesam Yacoub. This critical care time does not reflect procedure time, or teaching time or supervisory time of PA/NP/Med student/Med Resident etc but could involve care discussion time.  Alyson ReedyWesam G. Yacoub, M.D. [facility]/Critical [facility] Medicine. Pager: [phone]. After hours pager: [phone].

## 2016-10-28 NOTE — Progress Notes (Signed)
Pharmacy Antibiotic Note  Melanie [name] is a 50 y.o. female admitted on 10/24/2016 with bacteremia.  Pharmacy has been consulted for unasyn and vancomycin dosing. BCID positive for staph species with resistance marker and cultures show GPC/clusters in 1/2 cultures (? Contaminant). Of note, CT scan shows cerebral herniation. -WBC= 19, SCr= 0.94 and CrCl ~ 70, UOP 1.1 ml/kg/hr  Plan: -Change vancomycin to 750mg  IV q12h -Will follow renal function, cultures and clinical progress   Height: 5\' 7"  (170.2 cm) Weight: 150 lb 12.7 oz (68.4 kg) IBW/kg (Calculated) : 61.6  Temp (24hrs), Avg:100.9 F (38.3 C), Min:97.7 F (36.5 C), Max:104.6 F (40.3 C)   Recent Labs Lab 10/25/2016 2006 10/27/16 0053 10/27/16 0357 10/27/16 1057 10/28/16 0311  WBC 24.2* 16.7* 20.7*  --  19.0*  CREATININE  --  1.46*  1.48*  --  1.26* 0.94  LATICACIDVEN  --  7.1* 7.8*  --   --     Estimated Creatinine Clearance: 71.2 mL/min (by C-G formula based on SCr of 0.94 mg/dL).     Antimicrobials this admission: 11/6 Unasyn >>  11/7 vanc  Microbiology results: 11/6 BCID- staph species w/ MRSA detected 11/6 BCx x2: GPC/clusters 1/2 11/7 MRSA PCR: neg  Thank you for allowing pharmacy to be a part of this patient's care.  Harland GermanAndrew Trula Frede, Pharm D 10/28/2016 9:31 AM

## 2016-10-28 NOTE — Progress Notes (Signed)
eLink Physician-Brief Progress Note Patient Name: Evorn GongSue Stierwalt DOB: Nov 26, 1966 MRN: [id number]   Date of Service  10/28/2016  HPI/Events of Note  Head CT Scan w/o contrast reveals cerebral herniation as I suspected clinically.    eICU Interventions  Ground team notified and they are in the process of notifying the family.      Intervention Category Major Interventions: Change in mental status - evaluation and management  Sommer,Steven Eugene 10/28/2016, 1:42 AM

## 2016-10-29 ENCOUNTER — Inpatient Hospital Stay (HOSPITAL_COMMUNITY): Payer: Medicaid Other

## 2016-10-29 DIAGNOSIS — G9382 Brain death: Secondary | ICD-10-CM

## 2016-10-29 LAB — BASIC METABOLIC PANEL
Anion gap: 10 (ref 5–15)
BUN: 10 mg/dL (ref 6–20)
CALCIUM: 8 mg/dL — AB (ref 8.9–10.3)
CO2: 24 mmol/L (ref 22–32)
CREATININE: 0.86 mg/dL (ref 0.44–1.00)
Chloride: 122 mmol/L — ABNORMAL HIGH (ref 101–111)
Glucose, Bld: 123 mg/dL — ABNORMAL HIGH (ref 65–99)
Potassium: 3.4 mmol/L — ABNORMAL LOW (ref 3.5–5.1)
SODIUM: 156 mmol/L — AB (ref 135–145)

## 2016-10-29 LAB — POCT I-STAT 3, ART BLOOD GAS (G3+)
ACID-BASE DEFICIT: 1 mmol/L (ref 0.0–2.0)
BICARBONATE: 24.7 mmol/L (ref 20.0–28.0)
BICARBONATE: 27.7 mmol/L (ref 20.0–28.0)
BICARBONATE: 25.2 mmol/L (ref 20.0–28.0)
Bicarbonate: 25.2 mmol/L (ref 20.0–28.0)
O2 SAT: 100 %
O2 SAT: 100 %
O2 Saturation: 100 %
O2 Saturation: 99 %
PCO2 ART: 39.6 mmHg (ref 32.0–48.0)
PCO2 ART: 45.5 mmHg (ref 32.0–48.0)
PCO2 ART: 67.3 mmHg — AB (ref 32.0–48.0)
PH ART: 7.406 (ref 7.350–7.450)
PO2 ART: 278 mmHg — AB (ref 83.0–108.0)
PO2 ART: 433 mmHg — AB (ref 83.0–108.0)
PO2 ART: 546 mmHg — AB (ref 83.0–108.0)
Patient temperature: 95
Patient temperature: 96.1
TCO2: 26 mmol/L (ref 0–100)
TCO2: 26 mmol/L (ref 0–100)
TCO2: 27 mmol/L (ref 0–100)
TCO2: 30 mmol/L (ref 0–100)
pCO2 arterial: 38.8 mmHg (ref 32.0–48.0)
pH, Arterial: 7.215 — ABNORMAL LOW (ref 7.350–7.450)
pH, Arterial: 7.352 (ref 7.350–7.450)
pH, Arterial: 7.403 (ref 7.350–7.450)
pO2, Arterial: 111 mmHg — ABNORMAL HIGH (ref 83.0–108.0)

## 2016-10-29 LAB — BLOOD GAS, ARTERIAL
Acid-base deficit: 1.4 mmol/L (ref 0.0–2.0)
Bicarbonate: 22.6 mmol/L (ref 20.0–28.0)
DRAWN BY: 40530
FIO2: 30
O2 SAT: 98.6 %
PEEP: 5 cmH2O
PH ART: 7.417 (ref 7.350–7.450)
Patient temperature: 97.9
Pressure control: 15 cmH2O
RATE: 20 resp/min
pCO2 arterial: 35.5 mmHg (ref 32.0–48.0)
pO2, Arterial: 137 mmHg — ABNORMAL HIGH (ref 83.0–108.0)

## 2016-10-29 LAB — COMPREHENSIVE METABOLIC PANEL
ALT: 139 U/L — ABNORMAL HIGH (ref 14–54)
AST: 102 U/L — ABNORMAL HIGH (ref 15–41)
Albumin: 2.6 g/dL — ABNORMAL LOW (ref 3.5–5.0)
Alkaline Phosphatase: 57 U/L (ref 38–126)
Anion gap: 6 (ref 5–15)
BILIRUBIN TOTAL: 1.1 mg/dL (ref 0.3–1.2)
BUN: 9 mg/dL (ref 6–20)
CHLORIDE: 129 mmol/L — AB (ref 101–111)
CO2: 24 mmol/L (ref 22–32)
Calcium: 8.3 mg/dL — ABNORMAL LOW (ref 8.9–10.3)
Creatinine, Ser: 0.72 mg/dL (ref 0.44–1.00)
Glucose, Bld: 111 mg/dL — ABNORMAL HIGH (ref 65–99)
POTASSIUM: 3.1 mmol/L — AB (ref 3.5–5.1)
Sodium: 159 mmol/L — ABNORMAL HIGH (ref 135–145)
TOTAL PROTEIN: 6.3 g/dL — AB (ref 6.5–8.1)

## 2016-10-29 LAB — GLUCOSE, CAPILLARY
GLUCOSE-CAPILLARY: 108 mg/dL — AB (ref 65–99)
GLUCOSE-CAPILLARY: 117 mg/dL — AB (ref 65–99)
GLUCOSE-CAPILLARY: 103 mg/dL — AB (ref 65–99)
GLUCOSE-CAPILLARY: 216 mg/dL — AB (ref 65–99)
Glucose-Capillary: 111 mg/dL — ABNORMAL HIGH (ref 65–99)
Glucose-Capillary: 136 mg/dL — ABNORMAL HIGH (ref 65–99)
Glucose-Capillary: 252 mg/dL — ABNORMAL HIGH (ref 65–99)

## 2016-10-29 LAB — CBC
HCT: 38.7 % (ref 36.0–46.0)
HEMATOCRIT: 36.1 % (ref 36.0–46.0)
Hemoglobin: 12.5 g/dL (ref 12.0–15.0)
Hemoglobin: 11.2 g/dL — ABNORMAL LOW (ref 12.0–15.0)
MCH: 29.1 pg (ref 26.0–34.0)
MCH: 28.2 pg (ref 26.0–34.0)
MCHC: 32.3 g/dL (ref 30.0–36.0)
MCHC: 31 g/dL (ref 30.0–36.0)
MCV: 90 fL (ref 78.0–100.0)
MCV: 90.9 fL (ref 78.0–100.0)
PLATELETS: 130 10*3/uL — AB (ref 150–400)
PLATELETS: 89 10*3/uL — AB (ref 150–400)
RBC: 4.3 MIL/uL (ref 3.87–5.11)
RBC: 3.97 MIL/uL (ref 3.87–5.11)
RDW: 14.4 % (ref 11.5–15.5)
RDW: 14.7 % (ref 11.5–15.5)
WBC: 17.5 10*3/uL — ABNORMAL HIGH (ref 4.0–10.5)
WBC: 11.5 10*3/uL — ABNORMAL HIGH (ref 4.0–10.5)

## 2016-10-29 LAB — ABO/RH: ABO/RH(D): B POS

## 2016-10-29 LAB — MAGNESIUM
MAGNESIUM: 2.6 mg/dL — AB (ref 1.7–2.4)
MAGNESIUM: 2.4 mg/dL (ref 1.7–2.4)

## 2016-10-29 LAB — PHOSPHORUS
PHOSPHORUS: 1.8 mg/dL — AB (ref 2.5–4.6)
PHOSPHORUS: 1.4 mg/dL — AB (ref 2.5–4.6)

## 2016-10-29 LAB — TYPE AND SCREEN
ABO/RH(D): B POS
Antibody Screen: NEGATIVE

## 2016-10-29 LAB — LACTIC ACID, PLASMA: LACTIC ACID, VENOUS: 0.8 mmol/L (ref 0.5–1.9)

## 2016-10-29 LAB — CK TOTAL AND CKMB (NOT AT ARMC)
CK TOTAL: 232 U/L (ref 38–234)
CK, MB: 7.1 ng/mL — AB (ref 0.5–5.0)
Relative Index: 3.1 — ABNORMAL HIGH (ref 0.0–2.5)

## 2016-10-29 LAB — CULTURE, BLOOD (ROUTINE X 2)

## 2016-10-29 LAB — TROPONIN I: Troponin I: 0.06 ng/mL (ref <0.03)

## 2016-10-29 LAB — BILIRUBIN, DIRECT: BILIRUBIN DIRECT: 0.1 mg/dL (ref 0.1–0.5)

## 2016-10-29 MED ORDER — DEXTROSE 5 % IV BOLUS
500.0000 mL | Freq: Once | INTRAVENOUS | Status: AC
  Administered 2016-10-29: 500 mL via INTRAVENOUS

## 2016-10-29 MED ORDER — SODIUM CHLORIDE 0.9 % IV SOLN
10.0000 ug/h | INTRAVENOUS | Status: DC
  Filled 2016-10-29: qty 10

## 2016-10-29 MED ORDER — DEXTROSE 50 % IV SOLN: 50.0000 mL | Freq: Once | INTRAVENOUS | Status: DC

## 2016-10-29 MED ORDER — DEXTROSE 5 % IV SOLN
36.0000 mmol | Freq: Once | INTRAVENOUS | Status: AC
  Administered 2016-10-29: 36 mmol via INTRAVENOUS
  Filled 2016-10-29: qty 12

## 2016-10-29 MED ORDER — ALBUTEROL SULFATE (5 MG/ML) 0.5% IN NEBU
2.5000 mg | INHALATION_SOLUTION | RESPIRATORY_TRACT | Status: DC | PRN
  Filled 2016-10-29: qty 0.5

## 2016-10-29 MED ORDER — HEPARIN SODIUM (PORCINE) 5000 UNIT/ML IJ SOLN
20370.0000 [IU] | Freq: Once | INTRAMUSCULAR | Status: DC
  Filled 2016-10-29: qty 4.07

## 2016-10-29 MED ORDER — SODIUM CHLORIDE 0.9 % IV SOLN
1000.0000 mg | INTRAVENOUS | Status: DC
  Filled 2016-10-29: qty 8

## 2016-10-29 MED ORDER — INSULIN ASPART 100 UNIT/ML ~~LOC~~ SOLN: 2.0000 [IU] | SUBCUTANEOUS | Status: DC

## 2016-10-29 MED ORDER — PHENYLEPHRINE HCL 10 MG/ML IJ SOLN
0.0000 ug/min | INTRAMUSCULAR | Status: DC
  Administered 2016-10-29: 50 ug/min via INTRAVENOUS
  Administered 2016-10-30: 10 ug/min via INTRAVENOUS
  Filled 2016-10-29 (×2): qty 4

## 2016-10-29 MED ORDER — LEVOTHYROXINE SODIUM 100 MCG IV SOLR: 20.0000 ug | Freq: Once | INTRAVENOUS | Status: DC

## 2016-10-29 MED ORDER — INSULIN ASPART 100 UNIT/ML ~~LOC~~ SOLN
2.0000 [IU] | SUBCUTANEOUS | Status: DC
  Administered 2016-10-29 (×2): 6 [IU] via SUBCUTANEOUS
  Administered 2016-10-30: 2 [IU] via SUBCUTANEOUS
  Administered 2016-10-30: 6 [IU] via SUBCUTANEOUS

## 2016-10-29 MED ORDER — SODIUM CHLORIDE 0.9 % IV SOLN
2000.0000 mg | Freq: Once | INTRAVENOUS | Status: DC
  Filled 2016-10-29: qty 16

## 2016-10-29 MED ORDER — SODIUM CHLORIDE 0.45 % IV BOLUS
1000.0000 mL | Freq: Once | INTRAVENOUS | Status: AC
  Administered 2016-10-29: 1000 mL via INTRAVENOUS

## 2016-10-29 MED ORDER — INSULIN ASPART 100 UNIT/ML ~~LOC~~ SOLN: 20.0000 [IU] | Freq: Once | SUBCUTANEOUS | Status: DC

## 2016-10-29 MED ORDER — IPRATROPIUM BROMIDE 0.02 % IN SOLN: 0.5000 mg | RESPIRATORY_TRACT | Status: DC | PRN

## 2016-10-29 MED ORDER — KCL IN DEXTROSE-NACL 20-5-0.45 MEQ/L-%-% IV SOLN
INTRAVENOUS | Status: DC
  Administered 2016-10-29 – 2016-10-30 (×2): via INTRAVENOUS
  Filled 2016-10-29 (×4): qty 1000

## 2016-10-29 MED ORDER — ALBUMIN HUMAN 5 % IV SOLN
25.0000 g | Freq: Once | INTRAVENOUS | Status: AC
  Administered 2016-10-29: 25 g via INTRAVENOUS
  Filled 2016-10-29: qty 250

## 2016-10-29 NOTE — Progress Notes (Signed)
Spoke with son upon arrival, informed for brain death, ok with withdrawal but would like to speak with donor service.  Will facilitate.  The patient is critically ill with multiple organ systems failure and requires high complexity decision making for assessment and support, frequent evaluation and titration of therapies, application of advanced monitoring technologies and extensive interpretation of multiple databases.   Critical Care Time devoted to patient care services described in this note is  25  Minutes. This time reflects time of care of this signee Dr Koren BoundWesam Yacoub. This critical care time does not reflect procedure time, or teaching time or supervisory time of PA/NP/Med student/Med Resident etc but could involve care discussion time.  Alyson ReedyWesam G. Yacoub, M.D. [facility]Critical [facility]. Pager: [phone]. After hours pager: [phone].

## 2016-10-29 NOTE — Procedures (Signed)
Central Venous Catheter Insertion Procedure Note Melanie GongSue [name] [id number] 12-22-1965  Procedure: Insertion of Central Venous Catheter Indications: Assessment of intravascular volume, Drug and/or fluid administration and Frequent blood sampling  Procedure Details Consent: Unable to obtain consent because of altered level of consciousness. Time Out: Verified patient identification, verified procedure, site/side was marked, verified correct patient position, special equipment/implants available, medications/allergies/relevent history reviewed, required imaging and test results available.  Performed  Maximum sterile technique was used including antiseptics, cap, gloves, gown, hand hygiene, mask and sheet. Skin prep: Chlorhexidine; local anesthetic administered A antimicrobial bonded/coated triple lumen catheter was placed in the left subclavian vein using the Seldinger technique.  Evaluation Blood flow good Complications: No apparent complications Patient did tolerate procedure well. Chest X-ray ordered to verify placement.  CXR: pending.   Melanie [name], [place] [facility]anie [name] Pulmonary & Critical Care Medicine Pager: [phone]  or [phone] , 5:25 PM  Melanie ReedyWesam G. [name], M.D. [facility]/Critical [facility]. Pager: [phone]. After hours pager: [phone].

## 2016-10-29 NOTE — Progress Notes (Signed)
Responded to page to support patient family at bedside.  Patient has been declared brain dead and family is preparing to be withdraw all life support. I supported staff and [place] donor as plans were being made to talk with family.  Per mother and son they had already shared with Doctor that patient want to be a donor. I prayed with family and remained with them until [place] Donor took family in conference room for consultation. Provided emotional, spiritual and grief support to family.  Facilitated information sharing between staff and family.    1500  Clinical Encounter Type  Visited With Patient and family together;Health care provider  Visit Type Initial;Psychological support;Spiritual support;Social support;Death  Referral From Nurse  Spiritual Encounters  Spiritual Needs Prayer;Emotional;Grief support  Stress Factors  Family Stress Factors Loss  Venida Jarvis[place], [place] Macbride, Chaplain,pager [phone]

## 2016-10-29 NOTE — Progress Notes (Signed)
Comfort measures on going with pt and family. CDS on unit for family support. Chaplin to pray at bedside with family. Family meeting to discuss future plan. Comfort cart at bedside.

## 2016-10-29 NOTE — Procedures (Signed)
Arterial Catheter Insertion Procedure Note Melanie [name] [id number] [birth year]  Procedure: Insertion of Arterial Catheter  Indications: Blood pressure monitoring and Frequent blood sampling  Procedure Details Consent: Unable to obtain consent because of altered level of consciousness. Time Out: Verified patient identification, verified procedure, site/side was marked, verified correct patient position, special equipment/implants available, medications/allergies/relevent history reviewed, required imaging and test results available.  Performed  Maximum sterile technique was used including antiseptics, cap, gloves, gown, hand hygiene, mask and sheet. Skin prep: Chlorhexidine; local anesthetic administered 20 gauge catheter was inserted into right radial artery using the Seldinger technique.  Evaluation Blood flow good; BP tracing good. Complications: No apparent complications.   Melanie [name], [place] [facility] Medicine Pager: [phone]  or [phone] , 5:25 PM  Melanie [name], M.D. [facility]. Pager: [phone]. After hours pager: [phone].

## 2016-10-29 NOTE — Progress Notes (Signed)
Apnea test performed per protocol w/ supervision on a RN and MD.  VSS t/o testing, no noted respirations.  ABG done- results reported to RN and MD.

## 2016-10-29 NOTE — Progress Notes (Addendum)
Formal brain death testing performed, CO2 rose to 71 from 38, no respiratory effort and neuro exam consistent with brain death.  Patient is declared dead at 12:15, CDS is notified and family is notified.  Awaiting time of extubation pending coordination with family and CDS.  Additional CC time of 55 minutes.  Alyson ReedyWesam G. Kelsey Edman, M.D. [facility]. Pager: [phone]. After hours pager: [phone].

## 2016-10-29 NOTE — Progress Notes (Signed)
RT NOTE:  Recruitment maneuver completed by Jennette KettleNeal w/ CDS.

## 2016-10-29 NOTE — Progress Notes (Signed)
PULMONARY / CRITICAL CARE MEDICINE   Name: Melanie [name] MRN: [id number] DOB: July 12, 1966    ADMISSION DATE:  10/22/2016 CONSULTATION DATE:  11/6  REFERRING MD:  Dr. Silverio LayYao EDP  CHIEF COMPLAINT:  Cardiac arrest  HISTORY OF PRESENT ILLNESS:  Patient is encephalopathic and/or intubated. Therefore history has been obtained from chart review. 50 year old female with known history of DM, bipolar, shizophrenia, and polysubstance abuse (heroin, cocaine) found down at home with an unwitnessed cardiac arrest. Last known well 30 minutes prior while speaking with boyfriend telephone. Upon EMS arrival she was found to be in asystole and ACLS protocol was initiated, and continued for 22 minutes. She briefly regained pulses and lost him again with ventricular fibrillation, which responded to epinephrine and defibrillation. She remained bradycardic and was paced in route to the emergency room. Upon arrival to the emergency department she was placed on ventilator and started on epinephrine infusion. Neurologic exam remained poor with pupils fixed and dilated, comatose, and no cough/gag. Pulmonary critical care asked to admit.  SUBJECTIVE:  No respiratory drive and no events overnight  VITAL SIGNS: BP 129/88   Pulse 94   Temp (!) 96.1 F (35.6 C) (Axillary)   Resp 20   Ht 5\' 7"  (1.702 m)   Wt 67.9 kg (149 lb 11.1 oz)   LMP  (LMP Unknown)   SpO2 100%   BMI 23.45 kg/m   HEMODYNAMICS:    VENTILATOR SETTINGS: Vent Mode: PCV FiO2 (%):  [30 %] 30 % Set Rate:  [20 bmp] 20 bmp Vt Set:  [360 mL] 360 mL PEEP:  [5 cmH20] 5 cmH20 Plateau Pressure:  [15 cmH20-19 cmH20] 18 cmH20  INTAKE / OUTPUT: I/O last 3 completed shifts: In: 5626.7 [I.V.:3851.7; Other:875; IV Piggyback:900] Out: 3430 [Urine:3030; Emesis/NG output:400]  PHYSICAL EXAMINATION: General:  Thin malnourished female on ventilator Neuro:  Comatose, no cough, no gag, pupils fixed and dilated and no respiratory drive. HEENT:  [place]/AT, no  appreciable JVD Cardiovascular:  RRR, no MRG Lungs:  Coarse bilaterally Abdomen:  Soft, non-distended Musculoskeletal:  No acute deformity Skin:  Grossly intact  LABS:  BMET  Recent Labs Lab 10/27/16 1057 10/28/16 0311 10/28/16 0618  0241  NA 144 145  --  156*  K 2.9* 2.5* 3.8 3.4*  CL 104 111  --  122*  CO2 26 22  --  24  BUN 20 15  --  10  CREATININE 1.26* 0.94  --  0.86  GLUCOSE 116* 122*  --  123*   Electrolytes  Recent Labs Lab 10/27/16 1057 10/28/16 0311  0241  CALCIUM 8.0* 7.4* 8.0*  MG 1.4* 1.6* 2.6*  PHOS 1.4* 2.7 1.8*   CBC  Recent Labs Lab 10/27/16 0357 10/28/16 0311  0241  WBC 20.7* 19.0* 17.5*  HGB 14.3 13.6 12.5  HCT 42.0 40.1 38.7  PLT 123* 134* 130*   Coag's  Recent Labs Lab 03-23-2016 2006 10/27/16 0053  APTT 37*  --   INR 1.28 1.10   Sepsis Markers  Recent Labs Lab 10/27/16 0053 10/27/16 0357  LATICACIDVEN 7.1* 7.8*   ABG  Recent Labs Lab 10/28/16 0042 10/28/16 0350  0410  PHART 7.502* 7.517* 7.417  PCO2ART 30.0* 30.9* 35.5  PO2ART 171.0* 118* 137*   Liver Enzymes  Recent Labs Lab 10/27/16 0053  AST 312*  ALT 180*  ALKPHOS 87  BILITOT 0.9  ALBUMIN 3.4*   Cardiac Enzymes  Recent Labs Lab 10/27/16 0053 10/27/16 1057  TROPONINI 0.04*  0.04* 0.06*   Glucose  Recent Labs Lab 10/28/16 1149 10/28/16 1610 10/28/16 2026  0001  0405  0731  GLUCAP 124* 126* 102* 111* 108* 136*   Imaging Dg Chest Port 1 View  Result Date:  CLINICAL DATA:  Intubated patient, respiratory and cardiac arrest, aspiration pneumonia. EXAM: PORTABLE CHEST 1 VIEW COMPARISON:  Portable chest x-ray of October 28, 2016 FINDINGS: Dextrocardia is again demonstrated. The lungs are well-expanded. There is no pleural effusion or pneumothorax. The heart and pulmonary vascularity are normal. The endotracheal tube tip lies 2.7 cm above the carina. The esophagogastric tube  tip projects below the inferior margin of the radiograph. The bony thorax is unremarkable. IMPRESSION: The support tubes are in stable position. There is no acute cardiopulmonary abnormality. Electronically Signed   By: David  SwazilandJordan M.D.   On:  07:59     STUDIES:  CT head >>>  CULTURES: Blood 11/6 >  ANTIBIOTICS: Unasyn 11/6  SIGNIFICANT EVENTS: 11/6 cardiac arrest  LINES/TUBES: ETT 11/6 >  DISCUSSION: 50 year old female with known psychiatric history and history of polysubstance abuse. Suffered an unwitnessed cardiac arrest with potentially 30 minutes of downtime prior to being found. Once found EMS was called and responded with at least 22 minutes of CPR. Initial rhythm asystole. Now his hemodynamic stable off pressors, but is on ventilator with very poor neurologic exam. Admit to ICU for supportive care and further diagnostic workup. Not a candidate for targeted temperature management and therapy.  ASSESSMENT / PLAN:  PULMONARY A: Inability to protect airway status post cardiac arrest  P:   Full vent support Formal apnea testing today as patient has no respiratory drive Follow ABG VAP bundle Pending apnea testing will notify family and consolidate plan.  CARDIOVASCULAR A:  Asystolic cardiac arrest 45mins, suspect in setting overdose. Situs inversus, dextrocardia  P:  Telemetry monitoring No hypothermia started given downtime. Labetalol for BP support  RENAL A:   Cr elevated slightly but UOP is adequate.  P:   Follow renal function and urine output D/C further blood draws.  GASTROINTESTINAL A:   No acute issues  P:   NPO Pepcid for SUP No TF  HEMATOLOGIC A:   No acute issues P: Follow CBC Subcutaneous heparin SCDs  INFECTIOUS A:   Leukocytosis - possibly in setting of aspiration, although no clear evidence of this  P:   Follow cultures  D/C abx.  ENDOCRINE A:   Diabetes mellitus  P:   D/C CBGs D/C ISS.  NEUROLOGIC A:     Acute anoxic encephalopathy, exam consistent with brain death. P:   D/C sedation. CT head with evidence of herniation. Neurology consult appreciated Perform apnea testing now.  FAMILY  - Updates: Spoke with mother, she is ready for withdrawal but daughter is not and son is intoxicated right now.  Will perform apnea testing formally as this will help family with decision making.  - Inter-disciplinary family meet or Palliative Care meeting due by:  11/12  The patient is critically ill with multiple organ systems failure and requires high complexity decision making for assessment and support, frequent evaluation and titration of therapies, application of advanced monitoring technologies and extensive interpretation of multiple databases.   Critical Care Time devoted to patient care services described in this note is  35  Minutes. This time reflects time of care of this signee Dr Koren BoundWesam Melaine Mcphee. This critical care time does not reflect procedure time, or teaching time or supervisory time of PA/NP/Med student/Med Resident etc but could involve care discussion time.  Rush Farmer, M.D. [facility] Pulmonary/Critical Care Medicine. Pager: [phone]. After hours pager: [phone].

## 2016-10-30 ENCOUNTER — Inpatient Hospital Stay (HOSPITAL_COMMUNITY): Payer: Medicaid Other

## 2016-10-30 LAB — POCT I-STAT 3, ART BLOOD GAS (G3+)
ACID-BASE DEFICIT: 1 mmol/L (ref 0.0–2.0)
ACID-BASE DEFICIT: 2 mmol/L (ref 0.0–2.0)
ACID-BASE EXCESS: 3 mmol/L — AB (ref 0.0–2.0)
ACID-BASE EXCESS: 1 mmol/L (ref 0.0–2.0)
ACID-BASE EXCESS: 2 mmol/L (ref 0.0–2.0)
Acid-base deficit: 1 mmol/L (ref 0.0–2.0)
BICARBONATE: 26.3 mmol/L (ref 20.0–28.0)
BICARBONATE: 22.2 mmol/L (ref 20.0–28.0)
BICARBONATE: 21.2 mmol/L (ref 20.0–28.0)
BICARBONATE: 21.9 mmol/L (ref 20.0–28.0)
BICARBONATE: 22.5 mmol/L (ref 20.0–28.0)
Bicarbonate: 24.3 mmol/L (ref 20.0–28.0)
O2 SAT: 100 %
O2 SAT: 99 %
O2 SAT: 99 %
O2 Saturation: 100 %
O2 Saturation: 100 %
O2 Saturation: 100 %
PCO2 ART: 32.6 mmHg (ref 32.0–48.0)
PH ART: 7.553 — AB (ref 7.350–7.450)
PH ART: 7.6 — AB (ref 7.350–7.450)
PO2 ART: 436 mmHg — AB (ref 83.0–108.0)
PO2 ART: 120 mmHg — AB (ref 83.0–108.0)
PO2 ART: 138 mmHg — AB (ref 83.0–108.0)
PO2 ART: 201 mmHg — AB (ref 83.0–108.0)
Patient temperature: 98.2
Patient temperature: 98.7
Patient temperature: 98.7
Patient temperature: 98.7
TCO2: 27 mmol/L (ref 0–100)
TCO2: 23 mmol/L (ref 0–100)
TCO2: 25 mmol/L (ref 0–100)
TCO2: 22 mmol/L (ref 0–100)
TCO2: 23 mmol/L (ref 0–100)
TCO2: 23 mmol/L (ref 0–100)
pCO2 arterial: 35.8 mmHg (ref 32.0–48.0)
pCO2 arterial: 22.5 mmHg — ABNORMAL LOW (ref 32.0–48.0)
pCO2 arterial: 27.5 mmHg — ABNORMAL LOW (ref 32.0–48.0)
pCO2 arterial: 26.6 mmHg — ABNORMAL LOW (ref 32.0–48.0)
pCO2 arterial: 31.7 mmHg — ABNORMAL LOW (ref 32.0–48.0)
pH, Arterial: 7.475 — ABNORMAL HIGH (ref 7.350–7.450)
pH, Arterial: 7.509 — ABNORMAL HIGH (ref 7.350–7.450)
pH, Arterial: 7.448 (ref 7.350–7.450)
pH, Arterial: 7.446 (ref 7.350–7.450)
pO2, Arterial: 358 mmHg — ABNORMAL HIGH (ref 83.0–108.0)
pO2, Arterial: 435 mmHg — ABNORMAL HIGH (ref 83.0–108.0)

## 2016-10-30 LAB — URINALYSIS, ROUTINE W REFLEX MICROSCOPIC
BILIRUBIN URINE: NEGATIVE
GLUCOSE, UA: NEGATIVE mg/dL
HGB URINE DIPSTICK: NEGATIVE
Ketones, ur: NEGATIVE mg/dL
Leukocytes, UA: NEGATIVE
Nitrite: NEGATIVE
Protein, ur: NEGATIVE mg/dL
SPECIFIC GRAVITY, URINE: 1.01 (ref 1.005–1.030)
pH: 8.5 — ABNORMAL HIGH (ref 5.0–8.0)

## 2016-10-30 LAB — COMPREHENSIVE METABOLIC PANEL
ALK PHOS: 50 U/L (ref 38–126)
ALT: 106 U/L — ABNORMAL HIGH (ref 14–54)
ANION GAP: 5 (ref 5–15)
AST: 76 U/L — ABNORMAL HIGH (ref 15–41)
Albumin: 2.5 g/dL — ABNORMAL LOW (ref 3.5–5.0)
BILIRUBIN TOTAL: 0.6 mg/dL (ref 0.3–1.2)
BUN: 8 mg/dL (ref 6–20)
CALCIUM: 7.9 mg/dL — AB (ref 8.9–10.3)
CO2: 23 mmol/L (ref 22–32)
CREATININE: 0.74 mg/dL (ref 0.44–1.00)
Chloride: 125 mmol/L — ABNORMAL HIGH (ref 101–111)
Glucose, Bld: 218 mg/dL — ABNORMAL HIGH (ref 65–99)
Potassium: 2.9 mmol/L — ABNORMAL LOW (ref 3.5–5.1)
SODIUM: 153 mmol/L — AB (ref 135–145)
TOTAL PROTEIN: 5.5 g/dL — AB (ref 6.5–8.1)

## 2016-10-30 LAB — URINE CULTURE: CULTURE: NO GROWTH

## 2016-10-30 LAB — CBC WITH DIFFERENTIAL/PLATELET
BASOS ABS: 0 10*3/uL (ref 0.0–0.1)
Basophils Relative: 0 %
EOS ABS: 0 10*3/uL (ref 0.0–0.7)
EOS PCT: 0 %
HCT: 32.1 % — ABNORMAL LOW (ref 36.0–46.0)
Hemoglobin: 10.2 g/dL — ABNORMAL LOW (ref 12.0–15.0)
Lymphocytes Relative: 10 %
Lymphs Abs: 1.5 10*3/uL (ref 0.7–4.0)
MCH: 28.8 pg (ref 26.0–34.0)
MCHC: 31.8 g/dL (ref 30.0–36.0)
MCV: 90.7 fL (ref 78.0–100.0)
MONO ABS: 0.5 10*3/uL (ref 0.1–1.0)
Monocytes Relative: 4 %
Neutro Abs: 13.6 10*3/uL — ABNORMAL HIGH (ref 1.7–7.7)
Neutrophils Relative %: 86 %
PLATELETS: 124 10*3/uL — AB (ref 150–400)
RBC: 3.54 MIL/uL — AB (ref 3.87–5.11)
RDW: 14.7 % (ref 11.5–15.5)
WBC: 15.6 10*3/uL — AB (ref 4.0–10.5)

## 2016-10-30 LAB — PROTIME-INR
INR: 1.19
PROTHROMBIN TIME: 15.2 s (ref 11.4–15.2)

## 2016-10-30 LAB — GLUCOSE, CAPILLARY
GLUCOSE-CAPILLARY: 115 mg/dL — AB (ref 65–99)
GLUCOSE-CAPILLARY: 65 mg/dL (ref 65–99)
GLUCOSE-CAPILLARY: 74 mg/dL (ref 65–99)
GLUCOSE-CAPILLARY: 82 mg/dL (ref 65–99)
GLUCOSE-CAPILLARY: 88 mg/dL (ref 65–99)
GLUCOSE-CAPILLARY: 90 mg/dL (ref 65–99)
GLUCOSE-CAPILLARY: 96 mg/dL (ref 65–99)
GLUCOSE-CAPILLARY: 99 mg/dL (ref 65–99)
Glucose-Capillary: 217 mg/dL — ABNORMAL HIGH (ref 65–99)
Glucose-Capillary: 96 mg/dL (ref 65–99)
Glucose-Capillary: 125 mg/dL — ABNORMAL HIGH (ref 65–99)
Glucose-Capillary: 122 mg/dL — ABNORMAL HIGH (ref 65–99)
Glucose-Capillary: 88 mg/dL (ref 65–99)

## 2016-10-30 LAB — BASIC METABOLIC PANEL
Anion gap: 4 — ABNORMAL LOW (ref 5–15)
BUN: 6 mg/dL (ref 6–20)
CALCIUM: 7.8 mg/dL — AB (ref 8.9–10.3)
CO2: 22 mmol/L (ref 22–32)
CREATININE: 0.64 mg/dL (ref 0.44–1.00)
Chloride: 121 mmol/L — ABNORMAL HIGH (ref 101–111)
GFR calc Af Amer: >60 mL/min (ref >60)
Glucose, Bld: 92 mg/dL (ref 65–99)
Potassium: 3.9 mmol/L (ref 3.5–5.1)
SODIUM: 147 mmol/L — AB (ref 135–145)

## 2016-10-30 LAB — CK TOTAL AND CKMB (NOT AT ARMC)
CK, MB: 3.3 ng/mL (ref 0.5–5.0)
Relative Index: 1.2 (ref 0.0–2.5)
Total CK: 282 U/L — ABNORMAL HIGH (ref 38–234)

## 2016-10-30 LAB — TROPONIN I: Troponin I: 0.04 ng/mL (ref <0.03)

## 2016-10-30 LAB — PHOSPHORUS
Phosphorus: 1.3 mg/dL — ABNORMAL LOW (ref 2.5–4.6)
Phosphorus: 1.1 mg/dL — ABNORMAL LOW (ref 2.5–4.6)

## 2016-10-30 LAB — ECHOCARDIOGRAM COMPLETE
Height: 61 in
WEIGHTICAEL: 2395.08 [oz_av]

## 2016-10-30 LAB — PREGNANCY, URINE: Preg Test, Ur: NEGATIVE

## 2016-10-30 LAB — APTT: APTT: 33 s (ref 24–36)

## 2016-10-30 MED ORDER — SODIUM PHOSPHATES 45 MMOLE/15ML IV SOLN
30.0000 mmol | Freq: Once | INTRAVENOUS | Status: AC
  Administered 2016-10-30: 30 mmol via INTRAVENOUS
  Filled 2016-10-30: qty 10

## 2016-10-30 MED ORDER — SODIUM CHLORIDE 0.9% FLUSH: 10.0000 mL | INTRAVENOUS | Status: DC | PRN

## 2016-10-30 MED ORDER — POTASSIUM CHLORIDE IN NACL 20-0.45 MEQ/L-% IV SOLN
INTRAVENOUS | Status: DC
  Administered 2016-10-30 (×2): via INTRAVENOUS
  Filled 2016-10-30 (×4): qty 1000

## 2016-10-30 MED ORDER — VANCOMYCIN HCL IN DEXTROSE 1-5 GM/200ML-% IV SOLN
1000.0000 mg | INTRAVENOUS | Status: AC
  Filled 2016-10-30: qty 200

## 2016-10-30 MED ORDER — POTASSIUM CHLORIDE 10 MEQ/50ML IV SOLN
10.0000 meq | INTRAVENOUS | Status: AC
  Administered 2016-10-30 (×6): 10 meq via INTRAVENOUS
  Filled 2016-10-30 (×6): qty 50

## 2016-10-30 MED ORDER — DEXTROSE 50 % IV SOLN
25.0000 mL | Freq: Once | INTRAVENOUS | Status: AC
Start: 2016-10-30 — End: 2016-10-30
  Administered 2016-10-30: 25 mL via INTRAVENOUS

## 2016-10-30 MED ORDER — SODIUM CHLORIDE 0.9% FLUSH
10.0000 mL | Freq: Two times a day (BID) | INTRAVENOUS | Status: DC
  Administered 2016-10-30 (×2): 10 mL

## 2016-10-30 MED ORDER — DEXTROSE 50 % IV SOLN
INTRAVENOUS | Status: AC
  Filled 2016-10-30: qty 50

## 2016-10-30 MED ORDER — VECURONIUM BROMIDE 10 MG IV SOLR
10.0000 mg | INTRAVENOUS | Status: AC
  Filled 2016-10-30: qty 10

## 2016-10-30 MED ORDER — DESMOPRESSIN ACETATE 4 MCG/ML IJ SOLN
1.0000 ug | Freq: Once | INTRAMUSCULAR | Status: AC
  Administered 2016-10-30: 1 ug via INTRAVENOUS
  Filled 2016-10-30: qty 1

## 2016-10-30 MED ORDER — HEPARIN SODIUM (PORCINE) 1000 UNIT/ML IJ SOLN
INTRAMUSCULAR | Status: AC
  Filled 2016-10-30: qty 1

## 2016-10-30 NOTE — Progress Notes (Signed)
PULMONARY / CRITICAL CARE MEDICINE   Name: Melanie [name] MRN: [id number] DOB: [birth year]    ADMISSION DATE:  11/05/2016 CONSULTATION DATE:  11/6  REFERRING MD:  Dr. Silverio LayYao EDP  CHIEF COMPLAINT:  Cardiac arrest  HISTORY OF PRESENT ILLNESS:  Patient is encephalopathic and/or intubated. Therefore history has been obtained from chart review. 50 year old female with known history of DM, bipolar, shizophrenia, and polysubstance abuse (heroin, cocaine) found down at home with an unwitnessed cardiac arrest. Last known well 30 minutes prior while speaking with boyfriend telephone. Upon EMS arrival she was found to be in asystole and ACLS protocol was initiated, and continued for 22 minutes. She briefly regained pulses and lost him again with ventricular fibrillation, which responded to epinephrine and defibrillation. She remained bradycardic and was paced in route to the emergency room. Upon arrival to the emergency department she was placed on ventilator and started on epinephrine infusion. Neurologic exam remained poor with pupils fixed and dilated, comatose, and no cough/gag. Pulmonary critical care asked to admit.  SUBJECTIVE:  Declared brain dead on 11/9, now a donor.  VITAL SIGNS: BP 113/73   Pulse 85   Temp 98.2 F (36.8 C) (Axillary)   Resp 20   Ht 5\' 1"  (1.549 m)   Wt 67.9 kg (149 lb 11.1 oz)   LMP  (LMP Unknown)   SpO2 100%   BMI 28.28 kg/m   HEMODYNAMICS: CVP:  [3 mmHg-7 mmHg] 7 mmHg  VENTILATOR SETTINGS: Vent Mode: PCV FiO2 (%):  [30 %-100 %] 30 % Set Rate:  [20 bmp] 20 bmp Vt Set:  [360 mL] 360 mL PEEP:  [5 cmH20-8 cmH20] 8 cmH20 Plateau Pressure:  [12 cmH20-22 cmH20] 22 cmH20  INTAKE / OUTPUT: I/O last 3 completed shifts: In: 7809.5 [I.V.:3397.5; NG/GT:1000; IV Piggyback:3412] Out: 3730 [Urine:3730]  PHYSICAL EXAMINATION: General:  Thin malnourished female on ventilator Neuro:  Brain dead on exam and via apnea testing. HEENT:  [place]/AT, no appreciable  JVD Cardiovascular:  RRR, no MRG Lungs:  Coarse bilaterally Abdomen:  Soft, non-distended Musculoskeletal:  No acute deformity Skin:  Grossly intact  LABS:  BMET  Recent Labs Lab  0241  1730 10/30/16 0200  NA 156* 159* 153*  K 3.4* 3.1* 2.9*  CL 122* 129* 125*  CO2 24 24 23   BUN 10 9 8   CREATININE 0.86 0.72 0.74  GLUCOSE 123* 111* 218*   Electrolytes  Recent Labs Lab 10/28/16 0311  0241  1730 10/30/16 0200 10/30/16 0606  CALCIUM 7.4* 8.0* 8.3* 7.9*  --   MG 1.6* 2.6* 2.4  --   --   PHOS 2.7 1.8* 1.4*  --  1.3*   CBC  Recent Labs Lab  0241  1730 10/30/16 0200  WBC 17.5* 11.5* 15.6*  HGB 12.5 11.2* 10.2*  HCT 38.7 36.1 32.1*  PLT 130* 89* 124*   Coag's  Recent Labs Lab 10/22/2016 2006 10/27/16 0053 10/30/16 0200  APTT 37*  --  33  INR 1.28 1.10 1.19   Sepsis Markers  Recent Labs Lab 10/27/16 0053 10/27/16 0357  1755  LATICACIDVEN 7.1* 7.8* 0.8   ABG  Recent Labs Lab  1837  2345 10/30/16 0407  PHART 7.352 7.403 7.475*  PCO2ART 45.5 39.6 35.8  PO2ART 546.0* 433.0* 358.0*   Liver Enzymes  Recent Labs Lab 10/27/16 0053  1730 10/30/16 0200  AST 312* 102* 76*  ALT 180* 139* 106*  ALKPHOS 87 57 50  BILITOT 0.9 1.1 0.6  ALBUMIN 3.4* 2.6* 2.5*   Cardiac  Enzymes  Recent Labs Lab 10/27/16 0053 10/27/16 1057  1730  TROPONINI 0.04*  0.04* 0.06* 0.06*   Glucose  Recent Labs Lab 10/30/16 0137 10/30/16 0352 10/30/16 0547 10/30/16 0753 10/30/16 0815 10/30/16 0957  GLUCAP 217* 115* 96 65 125* 96   Imaging Dg Chest Port 1 View  Result Date: 10/30/2016 CLINICAL DATA:  Organ donor EXAM: PORTABLE CHEST 1 VIEW COMPARISON:  Yesterday FINDINGS: Left subclavian central line with tip at the cavoatrial junction. Lower endotracheal tube, tip 2 cm above the carina. An orogastric tube reaches the stomach. Mild basilar atelectasis. No edema,  effusion, or pneumothorax. Normal heart size for technique. IMPRESSION: 1. Lower endotracheal tube, tip 2 cm above the carina. 2. Mild basilar atelectasis. 3. Situs inversus Electronically Signed   By: Marnee SpringJonathon  Watts M.D.   On: 10/30/2016 07:36   Dg Chest Port 1 View  Result Date:  CLINICAL DATA:  Central line placement EXAM: PORTABLE CHEST 1 VIEW COMPARISON:  , 09-03-2016 FINDINGS: Endotracheal tube tip is approximately 2.2 cm superior to the carina. Esophageal tube tip is below the diaphragm and head towards the right upper abdomen. Left-sided central venous catheter tip parallels the left mediastinal border, presumably within left-sided SVC. No pneumothorax. Stable cardiomediastinal silhouette with dextro cardia and right arch. Mild bibasilar atelectasis. No effusion. IMPRESSION: 1. Left-sided central venous catheter tip superimposes the left mediastinal border in expected location of a left sided SVC. No pneumothorax. 2. Dextro cardia with right-sided aortic arch. 3. Hazy bibasilar atelectasis. Electronically Signed   By: Jasmine PangKim  Fujinaga M.D.   On:  19:40     STUDIES:  CT head >>>  CULTURES: Blood 11/6 >  ANTIBIOTICS: Unasyn 11/6  SIGNIFICANT EVENTS: 11/6 cardiac arrest  LINES/TUBES: ETT 11/6>>> L Cascade TLC 11/9>>> R radial a-line 11/9>>>  DISCUSSION: [age] year old female with known psychiatric history and history of polysubstance abuse. Suffered an unwitnessed cardiac arrest with potentially 30 minutes of downtime prior to being found. Once found EMS was called and responded with at least 22 minutes of CPR. Initial rhythm asystole. Now his hemodynamic stable off pressors, but is on ventilator with very poor neurologic exam. Admit to ICU for supportive care and further diagnostic workup. Not a candidate for targeted temperature management and therapy.  ASSESSMENT / PLAN:  PULMONARY A: Inability to protect airway status post cardiac arrest  P:   Full vent  support Patient has a respiratory drive Adjust vent for ABG Bronch today with BAL Organ harvest today.  CARDIOVASCULAR A:  Asystolic cardiac arrest 45mins, suspect in setting overdose. Situs inversus, dextrocardia  P:  Telemetry monitoring til harvest. Neo for BP support.  RENAL A:   Cr elevated slightly but UOP is adequate.  P:   Follow renal function and urine output. Correct electrolytes as indicated.  GASTROINTESTINAL A:   No acute issues  P:   NPO Pepcid for SUP No TF  HEMATOLOGIC A:   No acute issues P: D/C further blood draws. SCDs  INFECTIOUS A:   Leukocytosis - possibly in setting of aspiration, although no clear evidence of this  P:   Follow cultures  Unasyn and vanc for possible aspiration pneumonia  ENDOCRINE A:   Diabetes mellitus  P:   CBG monitoring  Sliding-scale insulin  NEUROLOGIC A:   Brain dead P:   Nothing to add  FAMILY  - Updates: Donor.  PCCM will sign off, please call back if needed.  - Inter-disciplinary family meet or Palliative Care meeting due by:  11/12  The patient is critically ill with multiple organ systems failure and requires high complexity decision making for assessment and support, frequent evaluation and titration of therapies, application of advanced monitoring technologies and extensive interpretation of multiple databases.   Critical Care Time devoted to patient care services described in this note is  35  Minutes. This time reflects time of care of this signee Dr Jennet Maduro. This critical care time does not reflect procedure time, or teaching time or supervisory time of PA/NP/Med student/Med Resident etc but could involve care discussion time.  Rush Farmer, M.D. [facility] Pulmonary/Critical Care Medicine. Pager: [phone]. After hours pager: [phone].

## 2016-10-30 NOTE — Progress Notes (Signed)
  Echocardiogram 2D Echocardiogram has been performed.  Arvil ChacoFoster, Endiya Klahr 10/30/2016, 9:00 AM

## 2016-10-30 NOTE — Progress Notes (Signed)
5 silver rings found on patient last night, put in a container with patient label, will give to OR nurse tonight.

## 2016-10-30 NOTE — Progress Notes (Signed)
Per CDS- recruitment maneuver perfomed per CDS representative/ per guidelines of: CPAP 0, 20 PEEP, 100% FIO2 X 20 seconds.  Pt then returned to previous vent settings.

## 2016-10-30 NOTE — Procedures (Signed)
Interventional Radiology Procedure Note  Procedure:  US guided liver biopsy  18 G core biopsy x 1 in right lobe of liver for transplant evaluation.  Liver appears grossly cirrhotic by US.  Jodi MarbleGlenn T. Fredia SorrowYamagata, M.D Pager:  [phone]

## 2016-10-30 NOTE — Progress Notes (Signed)
Vent changes made per Rosanne AshingJim w/ CDS.  RN aware.

## 2016-10-30 NOTE — Procedures (Signed)
Bronchoscopy Procedure Note Evorn GongSue Birkland [id number] [birth year]  Procedure: Bronchoscopy Indications: Obtain specimens for culture and/or other diagnostic studies  Procedure Details Consent: Risks of procedure as well as the alternatives and risks of each were explained to the (patient/caregiver).  Consent for procedure obtained. Time Out: Verified patient identification, verified procedure, site/side was marked, verified correct patient position, special equipment/implants available, medications/allergies/relevent history reviewed, required imaging and test results available.  Performed  In preparation for procedure, patient was given 100% FiO2 and bronchoscope lubricated. Sedation: None, patient is brain dead  Airway entered and the following bronchi were examined: RUL, RML, RLL, LUL, LLL and Bronchi.   BAL from RML Bronchoscope removed.    Evaluation Hemodynamic Status: BP stable throughout; O2 sats: stable throughout Patient's Current Condition: stable Specimens:  Sent purulent fluid Complications: No apparent complications Patient did tolerate procedure well.   Keven Soucy 10/30/2016

## 2016-10-30 NOTE — Progress Notes (Signed)
Gastric residual checked per CDS coordinator, 75 ml.

## 2016-10-30 NOTE — Progress Notes (Signed)
Pharmacy Antibiotic Note  Melanie [name] is a 50 y.o. female admitted on 10/30/2016 with bacteremia.  Pharmacy has been consulted for unasyn  dosing. BCID positive for staph species with resistance marker and cultures show GPC/clusters in 1/2 cultures (? Contaminant). Of note, CT scan shows cerebral herniation. Kidney function stable, WBC 15.6. Patient's organs to be harvested today. Declared brain dead on 11/9.   Plan: - Unasyn 3 gm every 8 hours - Follow renal function, cultures, management plan   Height: 5\' 1"  (154.9 cm) Weight: 149 lb 11.1 oz (67.9 kg) IBW/kg (Calculated) : 47.8  Temp (24hrs), Avg:96.8 F (36 C), Min:93 F (33.9 C), Max:99.3 F (37.4 C)   Recent Labs Lab 10/27/16 0053 10/27/16 0357 10/27/16 1057 10/28/16 0311  0241  1730  1755 10/30/16 0200  WBC 16.7* 20.7*  --  19.0* 17.5* 11.5*  --  15.6*  CREATININE 1.46*  1.48*  --  1.26* 0.94 0.86 0.72  --  0.74  LATICACIDVEN 7.1* 7.8*  --   --   --   --  0.8  --     Estimated Creatinine Clearance: 75.8 mL/min (by C-G formula based on SCr of 0.74 mg/dL).     Antimicrobials this admission: 11/6 Unasyn >>  11/7 vanc  Microbiology results: 11/6 BCID- staph species w/ MRSA detected 11/6 BCx x2: GPC/clusters 1/2 11/7 MRSA PCR: neg 11/9 Urine culture:  11/9 Respiratory culture: No organisms seen  Thank you for allowing pharmacy to be a part of this patient's care.  Bailey MechEmily Stewart, Ilda BassetPharm D PGY1 Pharmacy Resident Pager: [phone]  10/30/2016 10:54 AM

## 2016-10-30 NOTE — Progress Notes (Signed)
Pt Melanie [name] has slowed down, CDS Rosanne AshingJim is aware, orders to not give 250 mL down OG tube anymore, and change to .45 NS with 20 KCL @ 125. Will continue to monitor closely.

## 2016-10-30 NOTE — Progress Notes (Signed)
Residual 350, CDS aware, will hold 250 intake for 1200, and resume @ 1400.

## 2016-10-30 NOTE — Progress Notes (Signed)
Hypoglycemic Event  CBG: 65  Treatment: D50 IV 25 mL  Symptoms: None  Follow-up CBG: Time:0815 CBG Result:125  Possible Reasons for Event: Inadequate meal intake  Comments/MD notified: CDS aware    Melanie [name] K

## 2016-10-30 NOTE — Progress Notes (Signed)
RT NOTE:  Recruitment maneuver completed by Neal w/ CDS.  

## 2016-10-30 NOTE — Procedures (Signed)
Bedside Bronchoscopy Procedure Note Melanie [name] [id number] 1966-04-07  Procedure: Bronchoscopy Indications: Diagnostic evaluation of the airways and for possible lung donation  Procedure Details: ET Tube Size: ET Tube secured at lip (cm): Bite block in place: No- brain death  In preparation for procedure, Patient hyper-oxygenated with 100 % FiO2 Airway entered and the following bronchi were examined: Bronchi.   Bronchoscope removed.    Evaluation BP 113/73   Pulse 85   Temp 98.2 F (36.8 C) (Axillary)   Resp 20   Ht 5\' 1"  (1.549 m)   Wt 149 lb 11.1 oz (67.9 kg)   LMP  (LMP Unknown)   SpO2 100%   BMI 28.28 kg/m  Breath Sounds:Clear O2 sats: stable throughout Patient's Current Condition: stable Specimens:  Collected, waiting on MD orders for specimens.  RN aware Complications: No apparent complications Patient did tolerate procedure well.   Jennette KettleBrowning, Semir Brill Joy 10/30/2016, 9:44 AM

## 2016-10-30 NOTE — Progress Notes (Signed)
Gastric residual 330 ml at 0530 check

## 2016-10-31 ENCOUNTER — Inpatient Hospital Stay (HOSPITAL_COMMUNITY): Payer: Medicaid Other | Admitting: Certified Registered Nurse Anesthetist

## 2016-10-31 ENCOUNTER — Ambulatory Visit (HOSPITAL_COMMUNITY)
Admission: EM | Admit: 2016-10-31 | Discharge: 2016-10-31 | Disposition: A | Discharge status: Home / Self Care | Payer: Medicaid Other | Source: Ambulatory Visit | Attending: Pulmonary Disease | Admitting: Pulmonary Disease

## 2016-10-31 ENCOUNTER — Encounter (HOSPITAL_COMMUNITY): Admission: EM | Disposition: E | Payer: Self-pay | Source: Home / Self Care | Attending: Pulmonary Disease

## 2016-10-31 HISTORY — PX: ORGAN PROCUREMENT: SHX5270

## 2016-10-31 LAB — CULTURE, BLOOD (ROUTINE X 2): CULTURE: NO GROWTH

## 2016-10-31 LAB — GLUCOSE, CAPILLARY: Glucose-Capillary: 94 mg/dL (ref 65–99)

## 2016-10-31 SURGERY — SURGICAL PROCUREMENT, ORGAN
Anesthesia: General | Site: Abdomen

## 2016-10-31 MED ORDER — 0.9 % SODIUM CHLORIDE (POUR BTL) OPTIME
TOPICAL | Status: DC | PRN
  Administered 2016-10-31: 1000 mL

## 2016-10-31 MED ORDER — ROCURONIUM BROMIDE 100 MG/10ML IV SOLN
INTRAVENOUS | Status: DC | PRN
  Administered 2016-10-31: 100 mg via INTRAVENOUS

## 2016-10-31 MED ORDER — PHENYLEPHRINE HCL 10 MG/ML IJ SOLN
INTRAVENOUS | Status: DC | PRN
  Administered 2016-10-31: 10 ug/min via INTRAVENOUS

## 2016-10-31 MED ORDER — ALBUMIN HUMAN 5 % IV SOLN
INTRAVENOUS | Status: DC | PRN
  Administered 2016-10-31: 04:00:00 via INTRAVENOUS

## 2016-10-31 MED ORDER — SODIUM CHLORIDE 0.9 % IV SOLN
INTRAVENOUS | Status: DC | PRN
  Administered 2016-10-31: 1000 mg via INTRAVENOUS

## 2016-10-31 MED ORDER — SODIUM CHLORIDE 0.9 % IV BOLUS (SEPSIS)
1000.0000 mL | Freq: Once | INTRAVENOUS | Status: AC
  Administered 2016-10-31: 1000 mL via INTRAVENOUS

## 2016-10-31 MED ORDER — LACTATED RINGERS IV SOLN
INTRAVENOUS | Status: DC | PRN
  Administered 2016-10-31: 03:00:00 via INTRAVENOUS

## 2016-10-31 MED ORDER — FENTANYL CITRATE (PF) 100 MCG/2ML IJ SOLN
INTRAMUSCULAR | Status: AC
  Filled 2016-10-31: qty 2

## 2016-10-31 MED ORDER — HEPARIN SODIUM (PORCINE) 5000 UNIT/ML IJ SOLN
20000.0000 [IU] | Freq: Once | INTRAMUSCULAR | Status: DC
  Filled 2016-10-31: qty 4

## 2016-10-31 MED ORDER — METHYLPREDNISOLONE SODIUM SUCC 125 MG IJ SOLR
INTRAMUSCULAR | Status: DC | PRN
  Administered 2016-10-31: 1000 mg via INTRAVENOUS

## 2016-10-31 MED ORDER — HEPARIN SODIUM (PORCINE) 1000 UNIT/ML IJ SOLN
INTRAMUSCULAR | Status: DC | PRN
  Administered 2016-10-31: 30000 [IU] via INTRAVENOUS

## 2016-10-31 MED ORDER — FENTANYL CITRATE (PF) 250 MCG/5ML IJ SOLN
INTRAMUSCULAR | Status: DC | PRN
  Administered 2016-10-31: 100 ug via INTRAVENOUS

## 2016-10-31 SURGICAL SUPPLY — 76 items
BLADE 10 SAFETY STRL DISP (BLADE) ×3 IMPLANT
BLADE SURG 10 STRL SS (BLADE) ×6 IMPLANT
BLADE SURG ROTATE 9660 (MISCELLANEOUS) ×3 IMPLANT
CANNULA VESSEL W/WING WO/VALVE (CANNULA) IMPLANT
CLEANER TIP ELECTROSURG 2X2 (MISCELLANEOUS) ×3 IMPLANT
CLIP TI MEDIUM 24 (CLIP) ×3 IMPLANT
CLIP TI WIDE RED SMALL 24 (CLIP) ×3 IMPLANT
CONT SPEC 4OZ CLIKSEAL STRL BL (MISCELLANEOUS) ×3 IMPLANT
CONT SPEC STER OR (MISCELLANEOUS) ×12 IMPLANT
COVER MAYO STAND STRL (DRAPES) ×3 IMPLANT
COVER SURGICAL LIGHT HANDLE (MISCELLANEOUS) ×3 IMPLANT
COVER TABLE BACK 60X90 (DRAPES) IMPLANT
DRAPE PROXIMA HALF (DRAPES) IMPLANT
DRAPE SLUSH MACHINE 52X66 (DRAPES) ×3 IMPLANT
DRESSING TELFA 8X3 (GAUZE/BANDAGES/DRESSINGS) ×3 IMPLANT
DRSG COVADERM 4X10 (GAUZE/BANDAGES/DRESSINGS) ×6 IMPLANT
DRSG COVADERM 4X8 (GAUZE/BANDAGES/DRESSINGS) ×3 IMPLANT
DRSG TELFA 3X8 NADH (GAUZE/BANDAGES/DRESSINGS) ×3 IMPLANT
DURAPREP 26ML APPLICATOR (WOUND CARE) IMPLANT
ELECT BLADE 6.5 EXT (BLADE) ×3 IMPLANT
ELECT REM PT RETURN 9FT ADLT (ELECTROSURGICAL) ×6
ELECTRODE REM PT RTRN 9FT ADLT (ELECTROSURGICAL) ×2 IMPLANT
GAUZE SPONGE 4X4 16PLY XRAY LF (GAUZE/BANDAGES/DRESSINGS) IMPLANT
GLOVE BIO SURGEON STRL SZ 6.5 (GLOVE) ×4 IMPLANT
GLOVE BIO SURGEON STRL SZ7 (GLOVE) ×6 IMPLANT
GLOVE BIO SURGEON STRL SZ7.5 (GLOVE) ×3 IMPLANT
GLOVE BIO SURGEONS STRL SZ 6.5 (GLOVE) ×2
GLOVE BIOGEL PI IND STRL 6.5 (GLOVE) ×2 IMPLANT
GLOVE BIOGEL PI IND STRL 8 (GLOVE) ×1 IMPLANT
GLOVE BIOGEL PI INDICATOR 6.5 (GLOVE) ×4
GLOVE BIOGEL PI INDICATOR 8 (GLOVE) ×2
GLOVE SURG ORTHO 8.0 STRL STRW (GLOVE) ×3 IMPLANT
GOWN STRL REUS W/ TWL LRG LVL3 (GOWN DISPOSABLE) ×5 IMPLANT
GOWN STRL REUS W/TWL LRG LVL3 (GOWN DISPOSABLE) ×10
KIT POST MORTEM ADULT 36X90 (BAG) ×3 IMPLANT
KIT ROOM TURNOVER OR (KITS) ×3 IMPLANT
LOOP VESSEL MAXI BLUE (MISCELLANEOUS) ×3 IMPLANT
LOOP VESSEL MINI RED (MISCELLANEOUS) ×3 IMPLANT
MANIFOLD NEPTUNE II (INSTRUMENTS) IMPLANT
NEEDLE BIOPSY 14X6 SOFT TISS (NEEDLE) ×3 IMPLANT
NS IRRIG 1000ML POUR BTL (IV SOLUTION) ×18 IMPLANT
PACK AORTA (CUSTOM PROCEDURE TRAY) ×3 IMPLANT
PAD ARMBOARD 7.5X6 YLW CONV (MISCELLANEOUS) ×6 IMPLANT
PENCIL BUTTON HOLSTER BLD 10FT (ELECTRODE) ×3 IMPLANT
SOLUTION BETADINE 4OZ (MISCELLANEOUS) ×3 IMPLANT
SPONGE INTESTINAL PEANUT (DISPOSABLE) ×6 IMPLANT
SPONGE LAP 18X18 X RAY DECT (DISPOSABLE) IMPLANT
STAPLER VISISTAT 35W (STAPLE) ×3 IMPLANT
SUCTION POOLE TIP (SUCTIONS) ×6 IMPLANT
SUT BONE WAX W31G (SUTURE) ×3 IMPLANT
SUT ETHIBOND 5 LR DA (SUTURE) ×6 IMPLANT
SUT ETHILON 1 LR 30 (SUTURE) ×12 IMPLANT
SUT ETHILON 2 LR (SUTURE) IMPLANT
SUT PROLENE 3 0 SH 48 (SUTURE) ×3 IMPLANT
SUT PROLENE 4 0 RB 1 (SUTURE) ×2
SUT PROLENE 4-0 RB1 .5 CRCL 36 (SUTURE) ×1 IMPLANT
SUT PROLENE 6 0 BV (SUTURE) ×3 IMPLANT
SUT SILK 0 TIES 10X30 (SUTURE) ×3 IMPLANT
SUT SILK 1 SH (SUTURE) IMPLANT
SUT SILK 1 TIES 10X30 (SUTURE) IMPLANT
SUT SILK 2 0 (SUTURE) ×2
SUT SILK 2 0 SH (SUTURE) IMPLANT
SUT SILK 2 0 SH CR/8 (SUTURE) ×3 IMPLANT
SUT SILK 2 0 TIES 10X30 (SUTURE) ×3 IMPLANT
SUT SILK 2-0 18XBRD TIE 12 (SUTURE) ×1 IMPLANT
SUT SILK 3 0 TIES 10X30 (SUTURE) ×3 IMPLANT
SWAB COLLECTION DEVICE MRSA (MISCELLANEOUS) IMPLANT
SYRINGE TOOMEY DISP (SYRINGE) ×3 IMPLANT
TAPE UMBILICAL 1/8 X36 TWILL (MISCELLANEOUS) ×6 IMPLANT
TOWEL OR 17X24 6PK STRL BLUE (TOWEL DISPOSABLE) ×3 IMPLANT
TOWEL OR 17X26 10 PK STRL BLUE (TOWEL DISPOSABLE) ×6 IMPLANT
TUBE ANAEROBIC SPECIMEN COL (MISCELLANEOUS) IMPLANT
TUBE CONNECTING 12'X1/4 (SUCTIONS) ×1
TUBE CONNECTING 12X1/4 (SUCTIONS) ×2 IMPLANT
WATER STERILE IRR 1000ML POUR (IV SOLUTION) IMPLANT
YANKAUER SUCT BULB TIP NO VENT (SUCTIONS) ×3 IMPLANT

## 2016-10-31 NOTE — Progress Notes (Signed)
Patient being transferred to OR for Kidney donation. OR Nurses at bedside, ICU RN, and Rosanne AshingJim from CDS.   All patient belongings: 5 rings, 1 necklace, and clothing that will be kept on unit. RN will inform AM secretary to contact family regarding belongings.

## 2016-10-31 NOTE — Transfer of Care (Signed)
Immediate Anesthesia Transfer of Care Note  Patient: Melanie [name]  Procedure(s) Performed: Procedure(s): ORGAN PROCUREMENT (N/A)  Patient Location: PACU and Organ Donor No transfer of car  Anesthesia Type:Organ donor  Level of Consciousness: Patient remains intubated per anesthesia plan  Airway & Oxygen Therapy: Organ donor  Post-op Assessment: Organ donor  Post vital signs: Reviewed  Last Vitals:  Vitals:   2016/04/27 0257 2016/04/27 0300  BP:    Pulse: 78 80  Resp: 10 10  Temp:      Last Pain:  Vitals:   2016/04/27 0000  TempSrc: Oral  PainSc:          Complications: Organ donor

## 2016-10-31 NOTE — OR Nursing (Addendum)
Cross clamp [id number] @ 0443Left kidney   And  @    0446Right kidney

## 2016-10-31 NOTE — Progress Notes (Signed)
Updated CDS regarding patient output greater than 25000mL/hr for last two occurrences. No new orders given at this time.

## 2016-10-31 NOTE — Anesthesia Preprocedure Evaluation (Addendum)
Anesthesia Evaluation  Patient identified by MRN, date of birth, ID band Patient unresponsive    Reviewed: Unable to perform ROS - Chart review only  Airway Mallampati: Intubated       Dental   Pulmonary pneumonia,    + rhonchi        Cardiovascular  Rhythm:Regular Rate:Normal  S/P CPR   Neuro/Psych PSYCHIATRIC DISORDERS Bipolar Disorder Schizophrenia Braan death    GI/Hepatic (+) Hepatitis -, C  Endo/Other  diabetes  Renal/GU      Musculoskeletal   Abdominal   Peds  Hematology   Anesthesia Other Findings   Reproductive/Obstetrics                            Anesthesia Physical Anesthesia Plan  ASA: VI  Anesthesia Plan: General   Post-op Pain Management:    Induction: Intravenous  Airway Management Planned: Oral ETT  Additional Equipment:   Intra-op Plan:   Post-operative Plan:   Informed Consent:   Plan Discussed with: CRNA, Anesthesiologist and Surgeon  Anesthesia Plan Comments:        Anesthesia Quick Evaluation

## 2016-10-31 NOTE — Progress Notes (Signed)
1 L NS bolus given over an hour per Rosanne AshingJim, CDS.

## 2016-11-01 ENCOUNTER — Encounter (HOSPITAL_COMMUNITY): Payer: Self-pay

## 2016-11-01 LAB — CULTURE, RESPIRATORY W GRAM STAIN: Culture: NO GROWTH

## 2016-11-01 LAB — CULTURE, BAL-QUANTITATIVE W GRAM STAIN: Special Requests: NORMAL

## 2016-11-01 LAB — CULTURE, BAL-QUANTITATIVE: CULTURE: NO GROWTH

## 2016-11-01 LAB — CULTURE, RESPIRATORY

## 2016-11-03 LAB — PNEUMOCYSTIS JIROVECI SMEAR BY DFA: PNEUMOCYSTIS JIROVECI AG: NEGATIVE

## 2016-11-04 ENCOUNTER — Ambulatory Visit (INDEPENDENT_AMBULATORY_CARE_PROVIDER_SITE_OTHER): Payer: Medicaid Other | Admitting: Orthopaedic Surgery

## 2016-11-20 DEATH — deceased

## 2016-11-25 NOTE — Discharge Summary (Signed)
NAMEvorn Gong:  [name], Melanie                 ACCOUNT NO.:  [id number]  MEDICAL RECORD NO.:  [id number]  LOCATION:  [facility]                         FACILITY:  [facility]  PHYSICIAN:  Felipa EvenerWesam Jake Neeraj Housand, MD  DATE OF BIRTH:  [birth year]  DATE OF ADMISSION:  08-19-16 DATE OF DISCHARGE:  11/05/2016                              DISCHARGE SUMMARY   DEATH SUMMARY  PRIMARY DIAGNOSIS/CAUSE OF DEATH:  Asystolic cardiac arrest.  SECONDARY DIAGNOSES:  Anoxic encephalopathy.  Acute respiratory failure due to encephalopathy.  Acute renal failure with elevation of creatinine.  Leukocytosis.  Diabetes mellitus.  Brain death. Polysubstance substance abuse.  The patient is a 50 year old female, with past medical history of polysubstance abuse, namely cocaine as well as psychiatric disorder, and diabetes, who was an unwitnessed cardiac arrest.  EMS was called.  The patient was found to be asystolic.  ACLS protocol was followed.  The patient was brought to the emergency department and PCM was asked to admit.  Noted at that time, the patient was completely unresponsive. Neo-Synephrine was required for BP support.  On October 29, 2016, the patient was noted to have signs and symptoms of brain death.  Neurology was involved.  Apnea test was performed and test revealed that the patient is in fact brain dead, all family was notified and they were agreeable to withdrawal, however, wanted to speak with the [place] donor 1st.  The patient was deemed to be appropriate for donation and [place] [facility] took over the care of patient at this point and [facility].     Felipa EvenerWesam Jake [name], MD     WJY/MEDQ  D:  11/24/2016  T:  11/25/2016  Job:  [id number]

## 2016-12-07 ENCOUNTER — Encounter (HOSPITAL_COMMUNITY): Payer: Self-pay

## 2016-12-07 NOTE — Anesthesia Postprocedure Evaluation (Signed)
Anesthesia Post Note  Patient: Melanie [name]  Procedure([name]) Performed: Procedure([name]) (LRB): ORGAN PROCUREMENT (N/A)  Patient location during evaluation: Other (operating room) Anesthesia Type: General Post-procedure mental status: organ donor. Pain control: organ donor. Respiratory status: organ donor. Anesthetic complications: no    Last Vitals:  Vitals:   02/21/2016 0257 02/21/2016 0300  BP:    Pulse: 78 80  Resp: 10 10  Temp:      Last Pain:  Vitals:   02/21/2016 0000  TempSrc: Oral  PainSc:                  Melanie [name]

## 2018-04-09 IMAGING — US US BIOPSY
1 series · 4 of 4 positions shown · non-contrast
Comparison: none

CLINICAL DATA: Cardiac arrest and anoxic brain injury. History of
polysubstance abuse. Request has been made to perform liver biopsy
prior to organ harvesting.

[Series 1: us biopsy · 0.22mm/px · 4 of 4 slices shown]
[im 1/4]
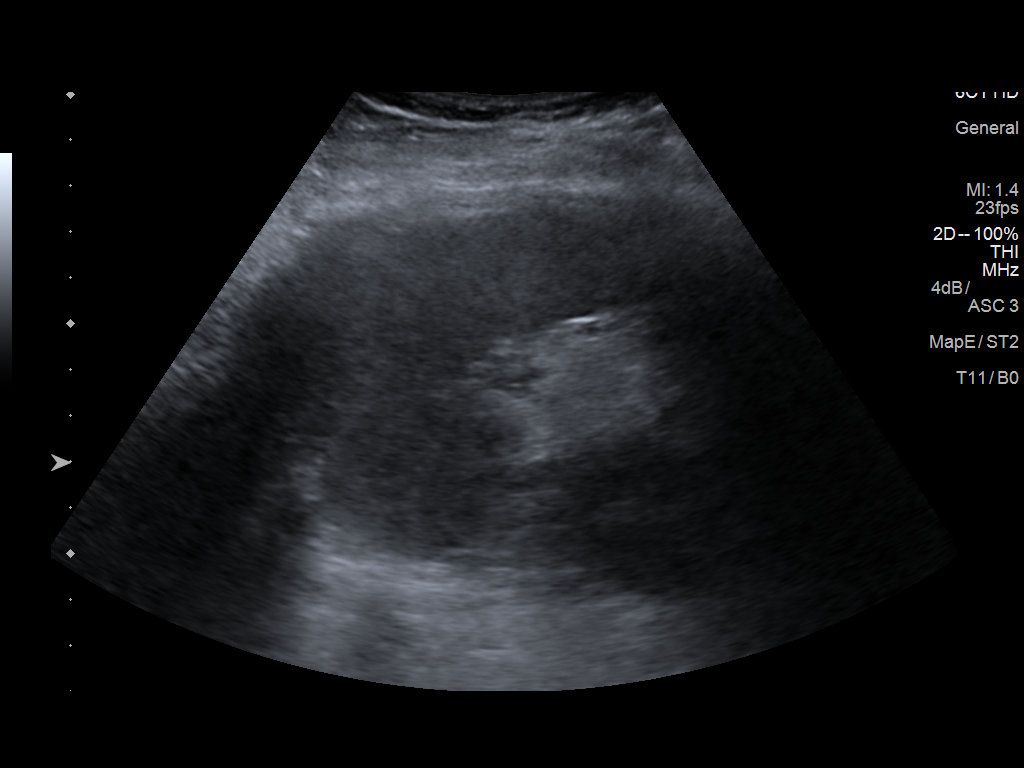
[im 2/4]
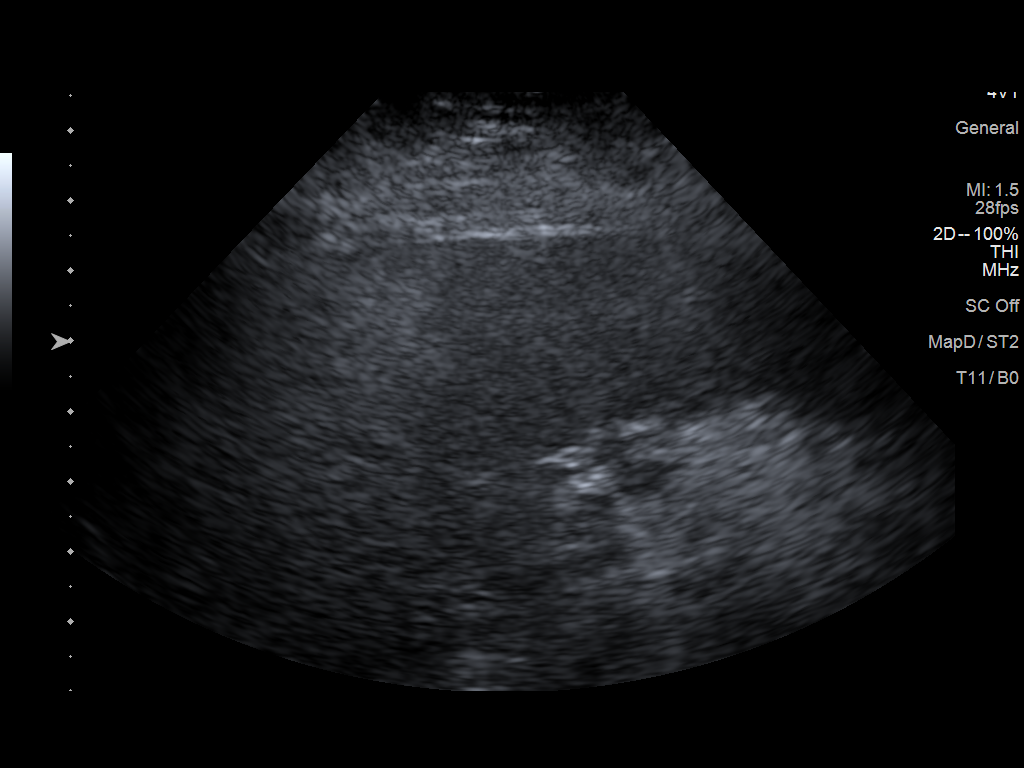
[im 3/4]
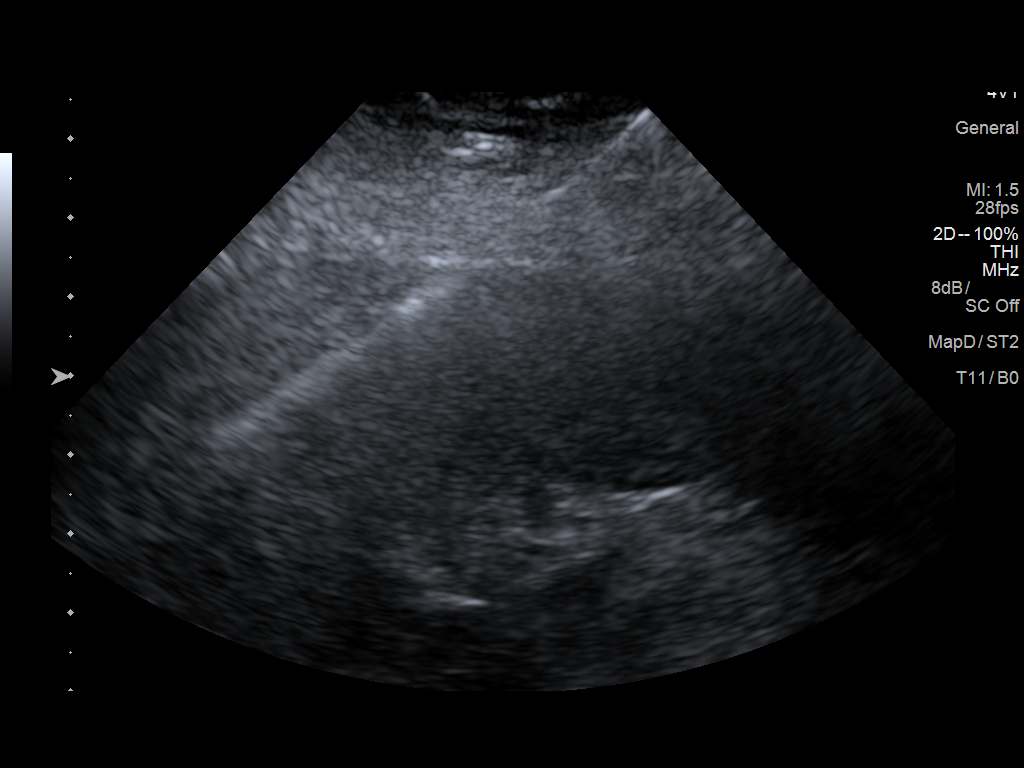
[im 4/4]
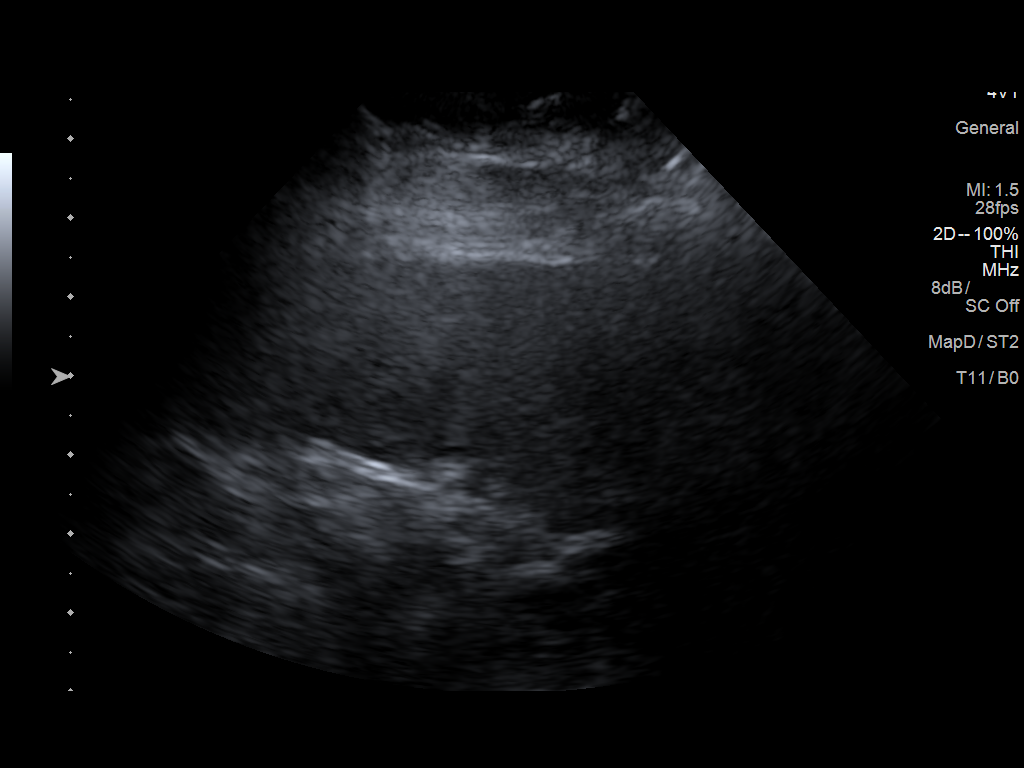

[4 of 4 positions shown; findings below may reference images not displayed]

EXAM:
ULTRASOUND GUIDED CORE BIOPSY OF LIVER

MEDICATIONS:
NONE

PROCEDURE:
Consent had been obtained from the patient's daughter prior to the
procedure.

The abdominal wall was prepped with chlorhexidine in a sterile
fashion, and a sterile drape was applied covering the operative
field.

Ultrasound was used to localize the liver. Under ultrasound
guidance, an 18 gauge needle core biopsy device was advanced into
the right lobe. A single core biopsy sample was obtained. This was
submitted in saline for immediate processing.

COMPLICATIONS:
None.
FINDINGS: The liver is shrunken and nodular in appearance consistent with
cirrhosis. Solid tissue sample was obtained.
IMPRESSION: Ultrasound-guided core biopsy performed of the liver prior to organ
harvesting. The liver is noted to be cirrhotic in appearance by
ultrasound.

## 2018-11-07 IMAGING — CR DG CHEST 1V PORT
1 series · 1 of 1 positions shown · non-contrast
Comparison: 10/27/2016

CLINICAL DATA: Shortness of Breath

EXAM:
PORTABLE CHEST 1 VIEW

[AP]
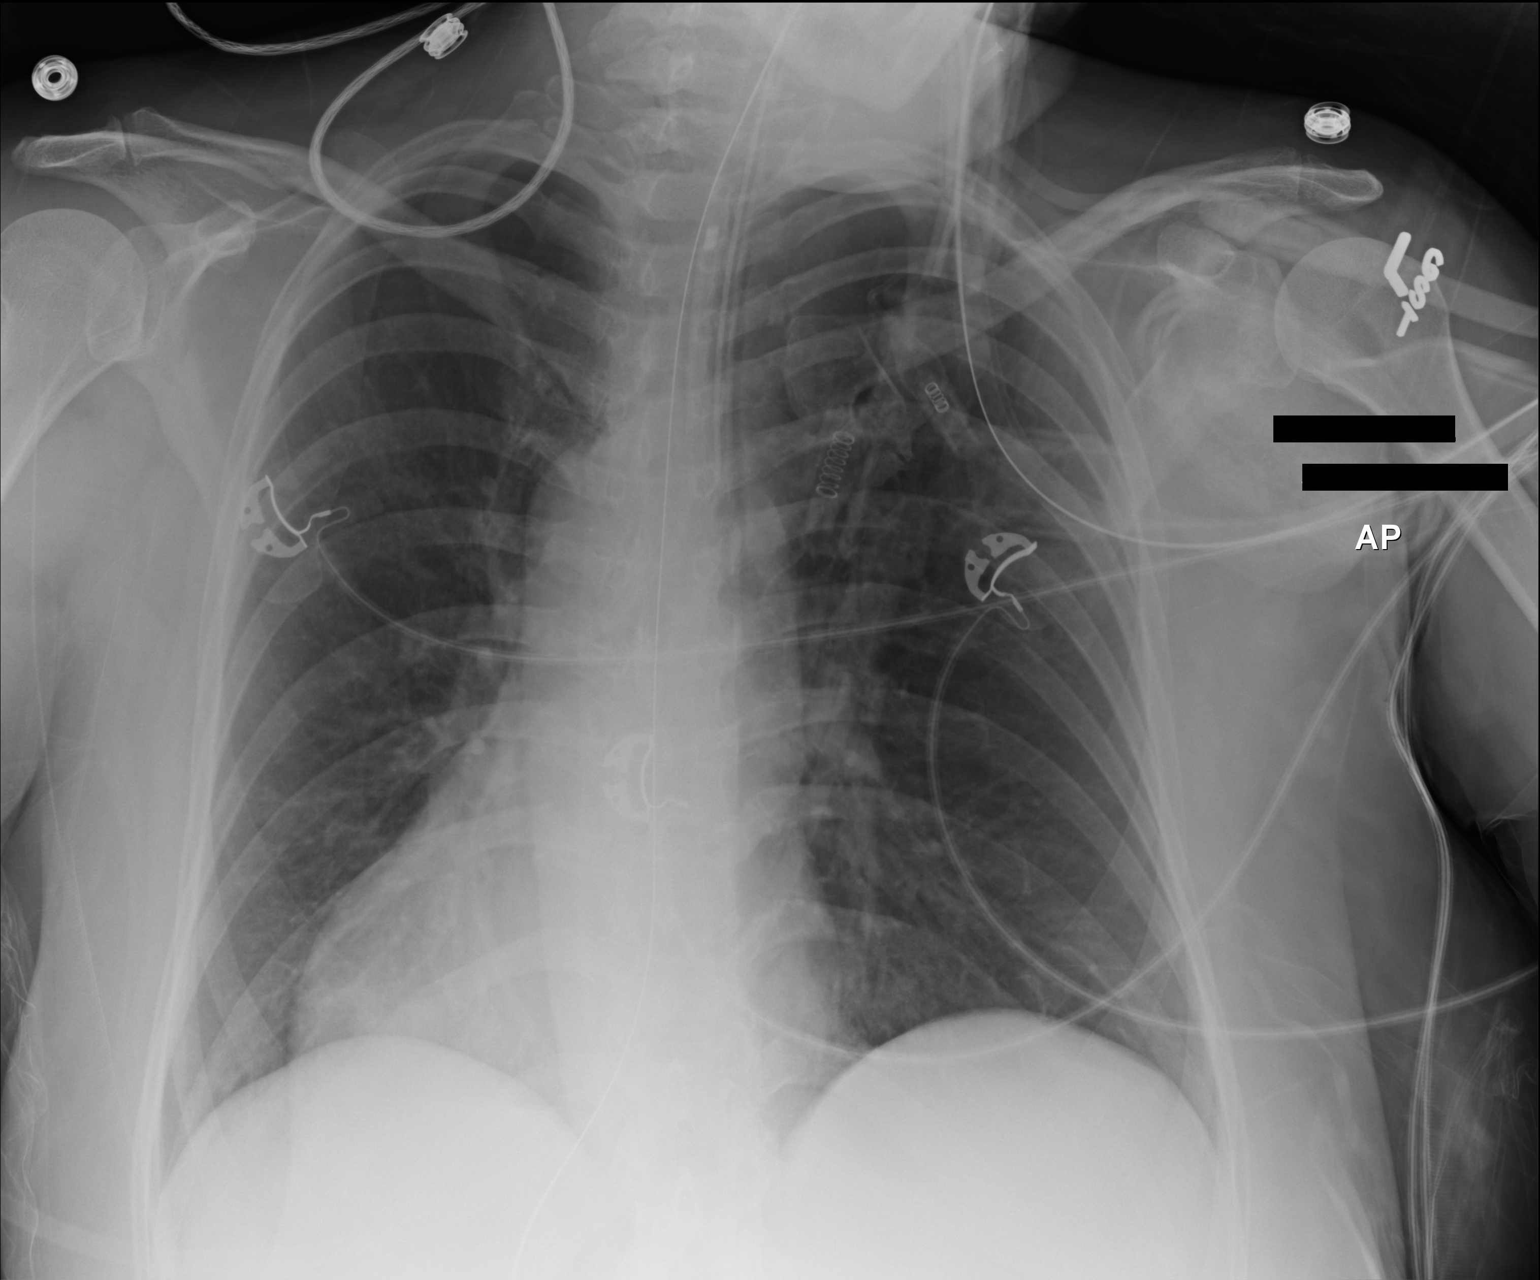

[1 of 1 positions shown; findings below may reference images not displayed]

FINDINGS: Changes of dextrocardia are again identified and stable. The
endotracheal tube is noted approximately 2 cm above the carina.
Nasogastric catheter courses into the stomach. The lungs are well
aerated bilaterally. No focal infiltrate or sizable effusion is
seen.
IMPRESSION: Tubes and lines as described above.

No acute abnormality seen.
